# Patient Record
Sex: Female | Born: 1955 | Race: Black or African American | Hispanic: No | Marital: Single | Smoking: Never smoker
Health system: Southern US, Community
[De-identification: ages and names within clinical notes are randomized; demographics above are authoritative.]

## PROBLEM LIST (undated history)

## (undated) ENCOUNTER — Ambulatory Visit
Admission: RE | Disposition: A | Discharge status: Home / Self Care | Payer: BC Managed Care – PPO | Source: Ambulatory Visit | Attending: Emergency Medicine | Admitting: Emergency Medicine

## (undated) DIAGNOSIS — L03021 Acute lymphangitis of right finger: Secondary | ICD-10-CM

## (undated) DIAGNOSIS — K219 Gastro-esophageal reflux disease without esophagitis: Secondary | ICD-10-CM

## (undated) DIAGNOSIS — R51 Headache: Secondary | ICD-10-CM

## (undated) HISTORY — PX: OTHER SURGICAL HISTORY: SHX169

---

## 1997-11-10 ENCOUNTER — Other Ambulatory Visit: Admission: RE | Admit: 1997-11-10 | Discharge: 1997-11-10 | Payer: Self-pay | Admitting: *Deleted

## 1998-01-05 ENCOUNTER — Encounter: Admission: RE | Admit: 1998-01-05 | Discharge: 1998-04-05 | Payer: Self-pay | Admitting: Internal Medicine

## 1998-04-28 ENCOUNTER — Encounter: Admission: RE | Admit: 1998-04-28 | Discharge: 1998-06-16 | Payer: Self-pay | Admitting: Internal Medicine

## 1998-11-23 ENCOUNTER — Encounter: Admission: RE | Admit: 1998-11-23 | Discharge: 1999-02-21 | Payer: Self-pay | Admitting: Internal Medicine

## 1998-12-27 ENCOUNTER — Other Ambulatory Visit: Admission: RE | Admit: 1998-12-27 | Discharge: 1998-12-27 | Payer: Self-pay | Admitting: *Deleted

## 1999-03-10 ENCOUNTER — Encounter: Admission: RE | Admit: 1999-03-10 | Discharge: 1999-05-18 | Payer: Self-pay | Admitting: Internal Medicine

## 1999-10-12 ENCOUNTER — Encounter: Admission: RE | Admit: 1999-10-12 | Discharge: 1999-11-09 | Payer: Self-pay | Admitting: Internal Medicine

## 2000-02-06 ENCOUNTER — Other Ambulatory Visit: Admission: RE | Admit: 2000-02-06 | Discharge: 2000-02-06 | Payer: Self-pay | Admitting: *Deleted

## 2000-05-13 ENCOUNTER — Encounter: Admission: RE | Admit: 2000-05-13 | Discharge: 2000-05-13 | Payer: Self-pay | Admitting: Internal Medicine

## 2000-05-13 ENCOUNTER — Encounter: Payer: Self-pay | Admitting: Internal Medicine

## 2000-05-16 ENCOUNTER — Encounter: Admission: RE | Admit: 2000-05-16 | Discharge: 2000-05-16 | Payer: Self-pay | Admitting: Internal Medicine

## 2000-05-16 ENCOUNTER — Encounter: Payer: Self-pay | Admitting: Internal Medicine

## 2001-07-10 ENCOUNTER — Encounter: Admission: RE | Admit: 2001-07-10 | Discharge: 2001-07-10 | Payer: Self-pay | Admitting: Internal Medicine

## 2001-07-10 ENCOUNTER — Encounter: Payer: Self-pay | Admitting: Internal Medicine

## 2001-07-22 ENCOUNTER — Encounter: Admission: RE | Admit: 2001-07-22 | Discharge: 2001-07-22 | Payer: Self-pay | Admitting: Internal Medicine

## 2001-07-22 ENCOUNTER — Encounter: Payer: Self-pay | Admitting: Internal Medicine

## 2002-02-13 ENCOUNTER — Encounter: Payer: Self-pay | Admitting: Internal Medicine

## 2002-02-13 ENCOUNTER — Encounter: Admission: RE | Admit: 2002-02-13 | Discharge: 2002-02-13 | Payer: Self-pay | Admitting: Internal Medicine

## 2003-07-20 ENCOUNTER — Encounter: Admission: RE | Admit: 2003-07-20 | Discharge: 2003-07-20 | Payer: Self-pay | Admitting: Internal Medicine

## 2003-09-27 ENCOUNTER — Encounter (INDEPENDENT_AMBULATORY_CARE_PROVIDER_SITE_OTHER): Payer: Self-pay | Admitting: Specialist

## 2003-09-27 ENCOUNTER — Ambulatory Visit (HOSPITAL_COMMUNITY): Admission: RE | Admit: 2003-09-27 | Discharge: 2003-09-27 | Payer: Self-pay | Admitting: Obstetrics & Gynecology

## 2004-10-16 ENCOUNTER — Other Ambulatory Visit: Admission: RE | Admit: 2004-10-16 | Discharge: 2004-10-16 | Payer: Self-pay | Admitting: Internal Medicine

## 2004-11-17 ENCOUNTER — Encounter: Admission: RE | Admit: 2004-11-17 | Discharge: 2004-11-17 | Payer: Self-pay | Admitting: Orthopaedic Surgery

## 2005-04-02 HISTORY — PX: UTERINE FIBROID SURGERY: SHX826

## 2005-11-07 ENCOUNTER — Other Ambulatory Visit: Admission: RE | Admit: 2005-11-07 | Discharge: 2005-11-07 | Payer: Self-pay | Admitting: Gynecology

## 2005-11-12 ENCOUNTER — Encounter: Admission: RE | Admit: 2005-11-12 | Discharge: 2005-11-12 | Payer: Self-pay | Admitting: Gynecology

## 2006-05-06 ENCOUNTER — Encounter: Admission: RE | Admit: 2006-05-06 | Discharge: 2006-05-06 | Payer: Self-pay | Admitting: Gynecology

## 2006-12-04 ENCOUNTER — Encounter: Admission: RE | Admit: 2006-12-04 | Discharge: 2006-12-04 | Payer: Self-pay | Admitting: Gynecology

## 2007-05-08 ENCOUNTER — Other Ambulatory Visit: Admission: RE | Admit: 2007-05-08 | Discharge: 2007-05-08 | Payer: Self-pay | Admitting: Gynecology

## 2008-01-16 ENCOUNTER — Encounter: Admission: RE | Admit: 2008-01-16 | Discharge: 2008-01-16 | Payer: Self-pay | Admitting: Internal Medicine

## 2010-01-31 HISTORY — PX: OTHER SURGICAL HISTORY: SHX169

## 2010-08-18 NOTE — Op Note (Signed)
NAME:  Marcia Walker, Marcia Walker                          ACCOUNT NO.:  [id number]   MEDICAL RECORD NO.:  [id number]                   PATIENT TYPE:  AMB   LOCATION:  [facility]                                  FACILITY:  [facility]   PHYSICIAN:  Genia Del, M.D.             DATE OF BIRTH:  01-28-1956   DATE OF PROCEDURE:  09/27/2003  DATE OF DISCHARGE:                                 OPERATIVE REPORT   PREOPERATIVE DIAGNOSES:  Menometrorrhagia with secondary anemia and  submucosal myoma.   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   INTERVENTION:  Hysteroscopy with resection of myoma and dilatation and  curettage.   ANESTHESIOLOGIST:  Burnett Corrente, M.D.   SURGEON:  Genia Del, M.D.   DESCRIPTION OF PROCEDURE:  Under general anesthesia with endotracheal  intubation, the patient is lithotomy position. She is prepped with Hibiclens  in the suprapubic, vulvar and vaginal areas and draped as usual. The vaginal  exam reveals an anteverted uterus irregular with uterine myoma mobile. No  adnexal mass, the cervix is long and closed.  No active bleeding is present.  We introduce the speculum in the vagina, the anterior lip of the cervix is  grasped with a tenaculum and a paracervical block is done with Nesacaine 1%  20 mL at 4 and 8 o'clock. We then dilate the cervix up to Hegar dilator #25.  The diagnostic hysteroscope is introduced, pictures are taken. The  submucosal myomas are seen, the one anteriorly has a larger submucosal  component. A small submucosal component is seen posteriorly.  No other  lesions are seen and the ostia are visualized and pictures are taken on both  sides. We complete dilatation without difficulty up to Hegar #31 and then  introduce the resectoscope with a double loop inside the intrauterine. The  anterior submucosal myoma is resected to about a 1/4.  It is quite large and  further  resection is considered risky for complications.  Also the deficit  at that point is at 570 mL.  The posterior myoma is just coming submucosally  at one area and that part is resected with the loop.  Hemostasis is  completed with the loop with coag and the instrument is then removed. A  curettage of the intrauterine cavity is done systematically on all surfaces  with a sharp curette.  The specimen is sent to pathology.  Hemostasis is  adequate inside the cavity. We then removed the tenaculum and it is noted  that the cervix is bleeding, mild tearing occurred at the level during  dilatation of the cervix. Therefore a stitch of 2-0 Vicryl is used as an X  suture to control hemostasis.  This effectively controlled hemostasis. The  rest of the instruments are removed. The estimated blood loss was about 100  mL, deficits were 570 mL.  No complications occurred and the patient was  transferred to the recovery room in good status.                                               Genia Del, M.D.    ML/MEDQ  D:  09/27/2003  T:  09/27/2003  Job:  [id number]

## 2011-03-03 HISTORY — PX: OTHER SURGICAL HISTORY: SHX169

## 2011-09-03 ENCOUNTER — Other Ambulatory Visit: Payer: Self-pay | Admitting: Orthopaedic Surgery

## 2011-09-03 DIAGNOSIS — M25512 Pain in left shoulder: Secondary | ICD-10-CM

## 2011-09-04 ENCOUNTER — Other Ambulatory Visit: Payer: Self-pay

## 2011-12-17 ENCOUNTER — Other Ambulatory Visit: Payer: Self-pay | Admitting: Orthopedic Surgery

## 2011-12-17 ENCOUNTER — Encounter (HOSPITAL_COMMUNITY): Payer: Self-pay | Admitting: *Deleted

## 2011-12-17 ENCOUNTER — Ambulatory Visit (HOSPITAL_COMMUNITY): Payer: BC Managed Care – PPO | Admitting: *Deleted

## 2011-12-17 ENCOUNTER — Encounter (HOSPITAL_COMMUNITY)
Admission: RE | Disposition: A | Discharge status: Home / Self Care | Payer: Self-pay | Source: Ambulatory Visit | Attending: Orthopedic Surgery

## 2011-12-17 ENCOUNTER — Ambulatory Visit (HOSPITAL_COMMUNITY)
Admission: RE | Admit: 2011-12-17 | Discharge: 2011-12-17 | Disposition: A | Discharge status: Home / Self Care | Payer: BC Managed Care – PPO | Source: Ambulatory Visit | Attending: Orthopedic Surgery | Admitting: Orthopedic Surgery

## 2011-12-17 ENCOUNTER — Encounter (HOSPITAL_COMMUNITY): Payer: Self-pay

## 2011-12-17 DIAGNOSIS — IMO0002 Reserved for concepts with insufficient information to code with codable children: Secondary | ICD-10-CM | POA: Insufficient documentation

## 2011-12-17 DIAGNOSIS — L03021 Acute lymphangitis of right finger: Secondary | ICD-10-CM | POA: Diagnosis present

## 2011-12-17 DIAGNOSIS — L03019 Cellulitis of unspecified finger: Secondary | ICD-10-CM | POA: Insufficient documentation

## 2011-12-17 HISTORY — PX: I & D EXTREMITY: SHX5045

## 2011-12-17 HISTORY — DX: Headache: R51

## 2011-12-17 HISTORY — DX: Acute lymphangitis of right finger: L03.021

## 2011-12-17 LAB — SURGICAL PCR SCREEN
MRSA, PCR: NEGATIVE
Staphylococcus aureus: POSITIVE — AB

## 2011-12-17 LAB — GRAM STAIN

## 2011-12-17 SURGERY — IRRIGATION AND DEBRIDEMENT EXTREMITY
Anesthesia: General | Site: Finger | Laterality: Right | Wound class: Dirty or Infected

## 2011-12-17 MED ORDER — MUPIROCIN 2 % EX OINT
TOPICAL_OINTMENT | Freq: Once | CUTANEOUS | Status: AC
  Administered 2011-12-17: 1 via NASAL

## 2011-12-17 MED ORDER — ONDANSETRON HCL 4 MG/2ML IJ SOLN
INTRAMUSCULAR | Status: DC | PRN
  Administered 2011-12-17: 4 mg via INTRAVENOUS

## 2011-12-17 MED ORDER — SUFENTANIL CITRATE 50 MCG/ML IV SOLN
INTRAVENOUS | Status: DC | PRN
  Administered 2011-12-17: 5 ug via INTRAVENOUS

## 2011-12-17 MED ORDER — PROPOFOL 10 MG/ML IV BOLUS
INTRAVENOUS | Status: DC | PRN
  Administered 2011-12-17: 120 mg via INTRAVENOUS

## 2011-12-17 MED ORDER — MIDAZOLAM HCL 5 MG/5ML IJ SOLN
INTRAMUSCULAR | Status: DC | PRN
  Administered 2011-12-17: 2 mg via INTRAVENOUS

## 2011-12-17 MED ORDER — BUPIVACAINE HCL (PF) 0.25 % IJ SOLN
INTRAMUSCULAR | Status: AC
  Filled 2011-12-17: qty 30

## 2011-12-17 MED ORDER — MIDAZOLAM HCL 2 MG/2ML IJ SOLN: 1.0000 mg | INTRAMUSCULAR | Status: DC | PRN

## 2011-12-17 MED ORDER — HYDROMORPHONE HCL PF 1 MG/ML IJ SOLN: 0.2500 mg | INTRAMUSCULAR | Status: DC | PRN

## 2011-12-17 MED ORDER — OXYCODONE-ACETAMINOPHEN 5-325 MG PO TABS: 1.0000 | ORAL_TABLET | ORAL | Status: DC | PRN

## 2011-12-17 MED ORDER — PROMETHAZINE HCL 25 MG/ML IJ SOLN: 6.2500 mg | INTRAMUSCULAR | Status: DC | PRN

## 2011-12-17 MED ORDER — LACTATED RINGERS IV SOLN
INTRAVENOUS | Status: DC
  Administered 2011-12-17 (×2): via INTRAVENOUS

## 2011-12-17 MED ORDER — SODIUM CHLORIDE 0.9 % IR SOLN
Status: DC | PRN
  Administered 2011-12-17: 1000 mL

## 2011-12-17 MED ORDER — CHLORHEXIDINE GLUCONATE 4 % EX LIQD: 60.0000 mL | Freq: Once | CUTANEOUS | Status: DC

## 2011-12-17 MED ORDER — BUPIVACAINE HCL (PF) 0.25 % IJ SOLN
INTRAMUSCULAR | Status: DC | PRN
  Administered 2011-12-17: 2 mL

## 2011-12-17 MED ORDER — LIDOCAINE HCL (CARDIAC) 20 MG/ML IV SOLN
INTRAVENOUS | Status: DC | PRN
  Administered 2011-12-17: 50 mg via INTRAVENOUS

## 2011-12-17 MED ORDER — MUPIROCIN 2 % EX OINT
TOPICAL_OINTMENT | CUTANEOUS | Status: AC
  Administered 2011-12-17: 1 via NASAL
  Filled 2011-12-17: qty 22

## 2011-12-17 SURGICAL SUPPLY — 53 items
BANDAGE CONFORM 2  STR LF (GAUZE/BANDAGES/DRESSINGS) IMPLANT
BANDAGE ELASTIC 3 VELCRO ST LF (GAUZE/BANDAGES/DRESSINGS) ×1 IMPLANT
BANDAGE ELASTIC 4 VELCRO ST LF (GAUZE/BANDAGES/DRESSINGS) ×1 IMPLANT
BANDAGE GAUZE ELAST BULKY 4 IN (GAUZE/BANDAGES/DRESSINGS) ×3 IMPLANT
BNDG CMPR 9X4 STRL LF SNTH (GAUZE/BANDAGES/DRESSINGS)
BNDG COHESIVE 1X5 TAN STRL LF (GAUZE/BANDAGES/DRESSINGS) ×1 IMPLANT
BNDG ESMARK 4X9 LF (GAUZE/BANDAGES/DRESSINGS) IMPLANT
CLOTH BEACON ORANGE TIMEOUT ST (SAFETY) ×2 IMPLANT
CORDS BIPOLAR (ELECTRODE) IMPLANT
COVER SURGICAL LIGHT HANDLE (MISCELLANEOUS) ×4 IMPLANT
CUFF TOURNIQUET SINGLE 18IN (TOURNIQUET CUFF) ×2 IMPLANT
DECANTER SPIKE VIAL GLASS SM (MISCELLANEOUS) ×2 IMPLANT
DRAIN PENROSE 1/4X12 LTX STRL (WOUND CARE) IMPLANT
DRAPE OEC MINIVIEW 54X84 (DRAPES) IMPLANT
DRAPE SURG 17X23 STRL (DRAPES) ×2 IMPLANT
DRSG PAD ABDOMINAL 8X10 ST (GAUZE/BANDAGES/DRESSINGS) ×2 IMPLANT
DURAPREP 26ML APPLICATOR (WOUND CARE) IMPLANT
ELECT REM PT RETURN 9FT ADLT (ELECTROSURGICAL)
ELECTRODE REM PT RTRN 9FT ADLT (ELECTROSURGICAL) IMPLANT
GAUZE PACKING IODOFORM 1/4X5 (PACKING) IMPLANT
GAUZE XEROFORM 1X8 LF (GAUZE/BANDAGES/DRESSINGS) ×2 IMPLANT
GLOVE BIO SURGEON STRL SZ8.5 (GLOVE) ×2 IMPLANT
GOWN SRG XL XLNG 56XLVL 4 (GOWN DISPOSABLE) ×1 IMPLANT
GOWN STRL NON-REIN LRG LVL3 (GOWN DISPOSABLE) ×2 IMPLANT
GOWN STRL NON-REIN XL XLG LVL4 (GOWN DISPOSABLE) ×2
HANDPIECE INTERPULSE COAX TIP (DISPOSABLE)
KIT BASIN OR (CUSTOM PROCEDURE TRAY) ×2 IMPLANT
KIT ROOM TURNOVER OR (KITS) ×2 IMPLANT
MANIFOLD NEPTUNE II (INSTRUMENTS) ×1 IMPLANT
NDL HYPO 25GX1X1/2 BEV (NEEDLE) IMPLANT
NDL HYPO 25X1 1.5 SAFETY (NEEDLE) ×1 IMPLANT
NEEDLE HYPO 25GX1X1/2 BEV (NEEDLE) ×2 IMPLANT
NEEDLE HYPO 25X1 1.5 SAFETY (NEEDLE) ×2 IMPLANT
NS IRRIG 1000ML POUR BTL (IV SOLUTION) ×2 IMPLANT
PACK ORTHO EXTREMITY (CUSTOM PROCEDURE TRAY) ×2 IMPLANT
PAD ARMBOARD 7.5X6 YLW CONV (MISCELLANEOUS) ×4 IMPLANT
PAD CAST 4YDX4 CTTN HI CHSV (CAST SUPPLIES) ×2 IMPLANT
PADDING CAST COTTON 4X4 STRL (CAST SUPPLIES)
SET HNDPC FAN SPRY TIP SCT (DISPOSABLE) IMPLANT
SPONGE GAUZE 4X4 12PLY (GAUZE/BANDAGES/DRESSINGS) ×3 IMPLANT
SPONGE LAP 18X18 X RAY DECT (DISPOSABLE) ×1 IMPLANT
SUCTION FRAZIER TIP 10 FR DISP (SUCTIONS) ×2 IMPLANT
SUT VICRYL RAPIDE 4/0 PS 2 (SUTURE) IMPLANT
SWAB CULTURE LIQUID MINI MALE (MISCELLANEOUS) ×1 IMPLANT
SYR 20CC LL (SYRINGE) IMPLANT
SYR CONTROL 10ML LL (SYRINGE) ×2 IMPLANT
TOWEL OR 17X24 6PK STRL BLUE (TOWEL DISPOSABLE) ×2 IMPLANT
TOWEL OR 17X26 10 PK STRL BLUE (TOWEL DISPOSABLE) ×2 IMPLANT
TUBE ANAEROBIC SPECIMEN COL (MISCELLANEOUS) ×1 IMPLANT
TUBE CONNECTING 12X1/4 (SUCTIONS) ×2 IMPLANT
UNDERPAD 30X30 INCONTINENT (UNDERPADS AND DIAPERS) ×2 IMPLANT
WATER STERILE IRR 1000ML POUR (IV SOLUTION) ×1 IMPLANT
YANKAUER SUCT BULB TIP NO VENT (SUCTIONS) ×2 IMPLANT

## 2011-12-17 NOTE — H&P (Signed)
Marcia Walker is an 56 y.o. female.   Chief Complaint: right ring felon HPI: 36 hour h/o right ring volar swelling c/w felon  No past medical history on file.  No past surgical history on file.  No family history on file. Social History:  does not have a smoking history on file. She does not have any smokeless tobacco history on file. Her alcohol and drug histories not on file.  Allergies: Allergies not on file  No prescriptions prior to admission    No results found for this or any previous visit (from the past 48 hour(s)). No results found.  Review of Systems  All other systems reviewed and are negative.    There were no vitals taken for this visit. Physical Exam  Constitutional: She is oriented to person, place, and time. She appears well-developed and well-nourished.  HENT:  Head: Normocephalic and atraumatic.  Cardiovascular: Normal rate.   Respiratory: Effort normal.  Musculoskeletal:       Hands: Neurological: She is alert and oriented to person, place, and time.  Skin: Skin is warm.  Psychiatric: She has a normal mood and affect. Her behavior is normal. Judgment and thought content normal.     Assessment/Plan As above  Plan I and D above Pratik Dalziel A 12/17/2011, 5:08 PM

## 2011-12-17 NOTE — Anesthesia Postprocedure Evaluation (Signed)
  Anesthesia Post-op Note  Patient: Marcia Walker  Procedure(s) Performed: Procedure(s) (LRB) with comments: IRRIGATION AND DEBRIDEMENT EXTREMITY (Right) - Right Ring Finger  Patient Location: PACU  Anesthesia Type: General  Level of Consciousness: awake and alert   Airway and Oxygen Therapy: Patient Spontanous Breathing  Post-op Pain: mild  Post-op Assessment: Post-op Vital signs reviewed, Patient's Cardiovascular Status Stable, Respiratory Function Stable, Patent Airway, No signs of Nausea or vomiting and Pain level controlled  Post-op Vital Signs: stable  Complications: No apparent anesthesia complications

## 2011-12-17 NOTE — Op Note (Signed)
See note [phone]

## 2011-12-17 NOTE — Brief Op Note (Signed)
12/17/2011  7:02 PM  PATIENT:  Marcia Walker  56 y.o. female  PRE-OPERATIVE DIAGNOSIS:  Right Ring Finger Infection  POST-OPERATIVE DIAGNOSIS:  Right ring finger infection  PROCEDURE:  Procedure(s) (LRB) with comments: IRRIGATION AND DEBRIDEMENT EXTREMITY (Right) - Right Ring Finger  SURGEON:  Surgeon(s) and Role:    * Marlowe Shores, MD - Primary  PHYSICIAN ASSISTANT:   ASSISTANTS: none   ANESTHESIA:   general  EBL:     BLOOD ADMINISTERED:none  DRAINS: none   LOCAL MEDICATIONS USED:  MARCAINE   2cc  SPECIMEN:  Lavage/Washing  DISPOSITION OF SPECIMEN:  micro  COUNTS:  YES  TOURNIQUET:   Total Tourniquet Time Documented: Upper Arm (Right) - 9 minutes  DICTATION: .Other Dictation: Dictation Number [id number]  PLAN OF CARE: Discharge to home after PACU  PATIENT DISPOSITION:  PACU - hemodynamically stable.   Delay start of Pharmacological VTE agent (>24hrs) due to surgical blood loss or risk of bleeding: not applicable

## 2011-12-17 NOTE — Anesthesia Preprocedure Evaluation (Addendum)
Anesthesia Evaluation  Patient identified by MRN, date of birth, ID band Patient awake    Reviewed: Allergy & Precautions, H&P , NPO status , Patient's Chart, lab work & pertinent test results, reviewed documented beta blocker date and time   Airway Mallampati: I TM Distance: >3 FB     Dental  (+) Teeth Intact and Dental Advisory Given   Pulmonary  breath sounds clear to auscultation        Cardiovascular Rhythm:Regular Rate:Normal     Neuro/Psych    GI/Hepatic   Endo/Other    Renal/GU      Musculoskeletal   Abdominal   Peds  Hematology   Anesthesia Other Findings   Reproductive/Obstetrics                          Anesthesia Physical Anesthesia Plan  ASA: I and Emergent  Anesthesia Plan: General   Post-op Pain Management:    Induction: Intravenous  Airway Management Planned: LMA  Additional Equipment:   Intra-op Plan:   Post-operative Plan: Extubation in OR  Informed Consent: I have reviewed the patients History and Physical, chart, labs and discussed the procedure including the risks, benefits and alternatives for the proposed anesthesia with the patient or authorized representative who has indicated his/her understanding and acceptance.     Plan Discussed with: CRNA, Surgeon and Anesthesiologist  Anesthesia Plan Comments:        Anesthesia Quick Evaluation

## 2011-12-17 NOTE — Transfer of Care (Signed)
Immediate Anesthesia Transfer of Care Note  Patient: Marcia Walker  Procedure(s) Performed: Procedure(s) (LRB) with comments: IRRIGATION AND DEBRIDEMENT EXTREMITY (Right) - Right Ring Finger  Patient Location: PACU  Anesthesia Type: General  Level of Consciousness: awake and alert   Airway & Oxygen Therapy: Patient Spontanous Breathing and Patient connected to face mask oxygen  Post-op Assessment: Report given to PACU RN and Post -op Vital signs reviewed and stable  Post vital signs: Reviewed and stable  Complications: No apparent anesthesia complications

## 2011-12-17 NOTE — Preoperative (Signed)
Beta Blockers   Reason not to administer Beta Blockers:Not Applicable 

## 2011-12-18 NOTE — Op Note (Signed)
NAMEDESTIN, KITTLER                ACCOUNT NO.:  [id number]  MEDICAL RECORD NO.:  [id number]  LOCATION:  [facility]                         FACILITY:  [facility]  PHYSICIAN:  Artist Pais. Queenie Aufiero, M.D.DATE OF BIRTH:  1955/08/09  DATE OF PROCEDURE:  12/17/2011 DATE OF DISCHARGE:  12/17/2011                              OPERATIVE REPORT   PREOPERATIVE DIAGNOSIS:  Right ring finger infection.  POSTOPERATIVE DIAGNOSIS:  Right ring finger infection.  PROCEDURE:  Incision and drainage above with open packing.  SURGEON:  Artist Pais. Mina Marble, M.D.  ASSISTANT:  None.  ANESTHESIA:  General.  COMPLICATIONS:  No complications.  DRAINS:  No drains.  DESCRIPTION OF PROCEDURE:  The patient was taken to the operating suite. After induction of general anesthesia, right upper extremity was prepped and draped in sterile fashion.  An Esmarch was used to exsanguinate the limb.  Tourniquet inflated to 250 mmHg.  At this point in time, a midline incision was made from the DIP joint flexion crease distally to incise a Phalen/paronychia along the ulnar side of the ring finger. Once the skin was incised, purulence was encountered.  It was cultured for aerobic and anaerobic bacteria, stat Gram stain.  A small bit of the nail plate was elevated off the nail bed and some purulence was seen coming from the nail as well.  This was removed with a longitudinal split removing the ulnar border of the nail plate.  This was irrigated, the wounds were packed open with Xeroform 1 x 8 cut into pieces the skin was then wrapped with 4 x 4s and Coban wrap.  The patient tolerated the procedure well and went to the recovery room in a stable fashion.     Artist Pais Mina Marble, M.D.     MAW/MEDQ  D:  12/17/2011  T:  12/18/2011  Job:  [id number]

## 2011-12-19 ENCOUNTER — Encounter (HOSPITAL_COMMUNITY): Payer: Self-pay | Admitting: Orthopedic Surgery

## 2011-12-21 LAB — CULTURE, ROUTINE-ABSCESS

## 2011-12-22 LAB — ANAEROBIC CULTURE

## 2014-12-28 ENCOUNTER — Other Ambulatory Visit: Payer: Self-pay | Admitting: Gynecology

## 2014-12-29 LAB — CYTOLOGY - PAP

## 2015-02-26 ENCOUNTER — Encounter (HOSPITAL_COMMUNITY): Payer: Self-pay | Admitting: Emergency Medicine

## 2015-02-26 ENCOUNTER — Emergency Department (HOSPITAL_COMMUNITY)
Admission: EM | Admit: 2015-02-26 | Discharge: 2015-02-27 | Disposition: A | Discharge status: Home / Self Care | Payer: BLUE CROSS/BLUE SHIELD | Attending: Emergency Medicine | Admitting: Emergency Medicine

## 2015-02-26 ENCOUNTER — Emergency Department (HOSPITAL_COMMUNITY): Payer: BLUE CROSS/BLUE SHIELD

## 2015-02-26 DIAGNOSIS — Z79899 Other long term (current) drug therapy: Secondary | ICD-10-CM | POA: Diagnosis not present

## 2015-02-26 DIAGNOSIS — K5900 Constipation, unspecified: Secondary | ICD-10-CM | POA: Diagnosis not present

## 2015-02-26 DIAGNOSIS — Z880 Allergy status to penicillin: Secondary | ICD-10-CM | POA: Insufficient documentation

## 2015-02-26 DIAGNOSIS — R1013 Epigastric pain: Secondary | ICD-10-CM | POA: Diagnosis present

## 2015-02-26 DIAGNOSIS — R1011 Right upper quadrant pain: Secondary | ICD-10-CM

## 2015-02-26 LAB — CBC
HEMATOCRIT: 39.7 % (ref 36.0–46.0)
Hemoglobin: 13.1 g/dL (ref 12.0–15.0)
MCH: 29.7 pg (ref 26.0–34.0)
MCHC: 33 g/dL (ref 30.0–36.0)
MCV: 90 fL (ref 78.0–100.0)
Platelets: 259 10*3/uL (ref 150–400)
RBC: 4.41 MIL/uL (ref 3.87–5.11)
RDW: 12.9 % (ref 11.5–15.5)
WBC: 4 10*3/uL (ref 4.0–10.5)

## 2015-02-26 LAB — LIPASE, BLOOD: LIPASE: 31 U/L (ref 11–51)

## 2015-02-26 LAB — COMPREHENSIVE METABOLIC PANEL
ALBUMIN: 4.8 g/dL (ref 3.5–5.0)
ALT: 18 U/L (ref 14–54)
AST: 21 U/L (ref 15–41)
Alkaline Phosphatase: 77 U/L (ref 38–126)
Anion gap: 9 (ref 5–15)
BUN: 8 mg/dL (ref 6–20)
CHLORIDE: 101 mmol/L (ref 101–111)
CO2: 28 mmol/L (ref 22–32)
CREATININE: 0.65 mg/dL (ref 0.44–1.00)
Calcium: 9.6 mg/dL (ref 8.9–10.3)
GFR calc Af Amer: >60 mL/min (ref >60)
GLUCOSE: 103 mg/dL — AB (ref 65–99)
POTASSIUM: 4 mmol/L (ref 3.5–5.1)
SODIUM: 138 mmol/L (ref 135–145)
Total Bilirubin: 0.6 mg/dL (ref 0.3–1.2)
Total Protein: 8.3 g/dL — ABNORMAL HIGH (ref 6.5–8.1)

## 2015-02-26 NOTE — ED Provider Notes (Signed)
MSE was initiated and I personally evaluated the patient and placed orders (if any) at  9:32 PM on February 26, 2015.  The patient appears stable so that the remainder of the MSE may be completed by another provider.Pt seen in triage.  Epigastric and RUQ pain, worse after eating, labs, RUQ u/s ordered, back to lobby until bed available  Rolan BuccoMelanie Yaresly Menzel, MD 02/26/15 2132

## 2015-02-26 NOTE — ED Notes (Signed)
U/s at bedside

## 2015-02-26 NOTE — ED Notes (Addendum)
Pt was told to take Amitiza and increased servings of fruit when she saw the PCP recently for constipation (has had constipation issues since she was little but never to this extreme). Was told by pharmacist not to do an enema with this medication but did anyway. Says the only thing that came out was mucous. Has been taking stool softeners and yogurt with no alleviation of constipation. Has increased water intake. Now is having intense abdominal pain. Able to burp-unable to eat without pain. Reports increased fatigue. Last normal BM was 2 weeks ago. Also reports increased coldness. Increased pain in RUQ. Denies blood in stools. No other c/c.

## 2015-02-27 MED ORDER — SODIUM CHLORIDE 0.9 % IV BOLUS (SEPSIS)
1000.0000 mL | Freq: Once | INTRAVENOUS | Status: AC
  Administered 2015-02-27: 1000 mL via INTRAVENOUS

## 2015-02-27 MED ORDER — PANTOPRAZOLE SODIUM 20 MG PO TBEC: 20.0000 mg | DELAYED_RELEASE_TABLET | Freq: Every day | ORAL | Status: DC

## 2015-02-27 MED ORDER — POLYETHYLENE GLYCOL 3350 17 G PO PACK: 17.0000 g | PACK | Freq: Every day | ORAL | Status: DC

## 2015-02-27 MED ORDER — GI COCKTAIL ~~LOC~~
30.0000 mL | Freq: Once | ORAL | Status: AC
  Administered 2015-02-27: 30 mL via ORAL
  Filled 2015-02-27: qty 30

## 2015-02-27 MED ORDER — PANTOPRAZOLE SODIUM 40 MG IV SOLR
40.0000 mg | Freq: Once | INTRAVENOUS | Status: AC
  Administered 2015-02-27: 40 mg via INTRAVENOUS
  Filled 2015-02-27: qty 40

## 2015-02-27 NOTE — ED Provider Notes (Signed)
CSN: [id number]     Arrival date & time 02/26/15  2015 History   First MD Initiated Contact with Patient 02/27/15 0123     Chief Complaint  Patient presents with  . Abdominal Pain     (Consider location/radiation/quality/duration/timing/severity/associated sxs/prior Treatment) Patient is a 59 y.o. female presenting with abdominal pain. The history is provided by the patient. No language interpreter was used.  Abdominal Pain Pain location:  Epigastric Pain quality: aching and sharp   Associated symptoms: constipation   Associated symptoms: no chest pain, no chills, no fever, no nausea, no shortness of breath and no vomiting   Associated symptoms comment:  Patient presents with one week of epigastric abdominal pain that started as intermittent pain related to any PO ingestion, liquid or solid. It has become constant pain that continues to be exacerbated with any PO ingestion. No vomiting. No fever. She reports constipation issues that have also been progressively worsening despite use of prescribed Amitiza, enemas, Miralax and bisacodyl. She denies history of or recent melena or blood per rectum. No previous abdominal surgeries. She has never had a colonoscopy. No history of ulcers.    Past Medical History  Diagnosis Date  . Headache(784.0)     with allergies   Past Surgical History  Procedure Laterality Date  . Right shoulder manipulation 03/2011  03/2011  . Left shouler manipulation  01/2010  . Other surgical history  15 yrs ago    cyst removed from end of spine  . Uterine fibroid surgery  2007  . I&d extremity  12/17/2011    Procedure: IRRIGATION AND DEBRIDEMENT EXTREMITY;  Surgeon: Marlowe Shores, MD;  Location: [facility] OR;  Service: Orthopedics;  Laterality: Right;  Right Ring Finger   History reviewed. No pertinent family history. Social History  Substance Use Topics  . Smoking status: Never Smoker   . Smokeless tobacco: Never Used  . Alcohol Use: No   OB History    No  data available     Review of Systems  Constitutional: Negative for fever and chills.  Respiratory: Negative.  Negative for shortness of breath.   Cardiovascular: Negative.  Negative for chest pain.  Gastrointestinal: Positive for abdominal pain and constipation. Negative for nausea and vomiting.  Musculoskeletal: Negative.   Skin: Negative.   Neurological: Negative.       Allergies  Prednisone; Claritin; Darvocet; Other; Penicillins; and Vibramycin  Home Medications   Prior to Admission medications   Medication Sig Start Date End Date Taking? Authorizing Provider  bisacodyl (DULCOLAX) 5 MG EC tablet Take 5 mg by mouth 2 (two) times daily as needed for moderate constipation (for constipation).   Yes Historical Provider, MD  cetirizine (ZYRTEC) 10 MG tablet Take 10 mg by mouth daily as needed. For allergies   Yes Historical Provider, MD  cycloSPORINE (RESTASIS) 0.05 % ophthalmic emulsion Place 1 drop into both eyes at bedtime as needed (for allergy eye).    Yes Historical Provider, MD  docusate sodium (COLACE) 100 MG capsule Take 100 mg by mouth 3 (three) times daily.   Yes Historical Provider, MD  lubiprostone (AMITIZA) 8 MCG capsule Take 8 mcg by mouth 2 (two) times daily with a meal.   Yes Historical Provider, MD  naproxen sodium (ANAPROX) 220 MG tablet Take 220 mg by mouth 2 (two) times daily as needed (for headache/pain.).   Yes Historical Provider, MD  sodium phosphate (FLEET) enema Place 1 enema rectally daily as needed (for severe constipation). follow package directions  Yes Historical Provider, MD  valACYclovir (VALTREX) 500 MG tablet Take 500 mg by mouth daily as needed (for flares.).  12/28/14  Yes Historical Provider, MD   BP 117/66 mmHg  Pulse 62  Temp(Src) 98 F (36.7 C) (Oral)  Resp 18  SpO2 99% Physical Exam  Constitutional: She is oriented to person, place, and time. She appears well-developed and well-nourished.  HENT:  Head: Normocephalic.  Neck: Normal  range of motion. Neck supple.  Cardiovascular: Normal rate and regular rhythm.   Pulmonary/Chest: Effort normal and breath sounds normal.  Abdominal: Soft. Bowel sounds are normal. There is tenderness. There is no rebound and no guarding.  Tenderness is limited to epigastric area.  Musculoskeletal: Normal range of motion.  Neurological: She is alert and oriented to person, place, and time.  Skin: Skin is warm and dry. No rash noted.  Psychiatric: She has a normal mood and affect.    ED Course  Procedures (including critical care time) Labs Review Labs Reviewed  COMPREHENSIVE METABOLIC PANEL - Abnormal; Notable for the following:    Glucose, Bld 103 (*)    Total Protein 8.3 (*)    All other components within normal limits  LIPASE, BLOOD  CBC    Imaging Review Dg Abd 1 View  02/26/2015  CLINICAL DATA:  Intense right lower quadrant abdominal pain. Increased fatigue. Last normal bowel movement was 2 weeks ago. EXAM: ABDOMEN - 1 VIEW COMPARISON:  None. FINDINGS: Gas and stool seen throughout the colon. No small or large bowel distention. Calcified phleboliths in the pelvis. Surgical clip in the pelvis. No radiopaque stones. Visualized bones appear intact. IMPRESSION: Nonobstructive bowel gas pattern with stool-filled colon suggesting constipation. Electronically Signed   By: Burman NievesWilliam  Stevens M.D.   On: 02/26/2015 22:01   [place] Abdomen Limited Ruq  02/26/2015  CLINICAL DATA:  Right upper quadrant pain since Friday. EXAM: US ABDOMEN LIMITED - RIGHT UPPER QUADRANT COMPARISON:  None. FINDINGS: Gallbladder: No gallstones or wall thickening visualized. No sonographic Murphy sign noted. Common bile duct: Diameter: 1.7 mm, normal Liver: No focal lesion identified. Within normal limits in parenchymal echogenicity. IMPRESSION: Normal examination. Electronically Signed   By: Burman NievesWilliam  Stevens M.D.   On: 02/26/2015 23:19   I have personally reviewed and evaluated these images and lab results as part of my  medical decision-making.   EKG Interpretation None      MDM   Final diagnoses:  RUQ pain   1. Epigastric pain 2. Constipation  Labs are unremarkable. US abdomen is negative for acute findings, no gall stones. Plain film abdomen showing only constipation. The patient gains pan relief with GI cocktail. IV fluids provided. Feel she would benefit from referral to GI for further evaluation and management of multi-faceted gastroenterologic complaints.     Elpidio AnisShari Briauna Gilmartin, PA-C 02/27/15 [id number]  Derwood KaplanAnkit Nanavati, MD 02/27/15 [phone]

## 2015-02-27 NOTE — ED Notes (Signed)
Pt has decreased some,  Pt is tolerating small bites of crackers and sprite

## 2015-02-27 NOTE — Discharge Instructions (Signed)
Constipation, Adult Constipation is when a person has fewer than three bowel movements a week, has difficulty having a bowel movement, or has stools that are dry, hard, or larger than normal. As people grow older, constipation is more common. A low-fiber diet, not taking in enough fluids, and taking certain medicines may make constipation worse.  CAUSES   Certain medicines, such as antidepressants, pain medicine, iron supplements, antacids, and water pills.   Certain diseases, such as diabetes, irritable bowel syndrome (IBS), thyroid disease, or depression.   Not drinking enough water.   Not eating enough fiber-rich foods.   Stress or travel.   Lack of physical activity or exercise.   Ignoring the urge to have a bowel movement.   Using laxatives too much.  SIGNS AND SYMPTOMS   Having fewer than three bowel movements a week.   Straining to have a bowel movement.   Having stools that are hard, dry, or larger than normal.   Feeling full or bloated.   Pain in the lower abdomen.   Not feeling relief after having a bowel movement.  DIAGNOSIS  Your health care provider will take a medical history and perform a physical exam. Further testing may be done for severe constipation. Some tests may include:  A barium enema X-ray to examine your rectum, colon, and, sometimes, your small intestine.   A sigmoidoscopy to examine your lower colon.   A colonoscopy to examine your entire colon. TREATMENT  Treatment will depend on the severity of your constipation and what is causing it. Some dietary treatments include drinking more fluids and eating more fiber-rich foods. Lifestyle treatments may include regular exercise. If these diet and lifestyle recommendations do not help, your health care provider may recommend taking over-the-counter laxative medicines to help you have bowel movements. Prescription medicines may be prescribed if over-the-counter medicines do not work.    HOME CARE INSTRUCTIONS   Eat foods that have a lot of fiber, such as fruits, vegetables, whole grains, and beans.  Limit foods high in fat and processed sugars, such as french fries, hamburgers, cookies, candies, and soda.   A fiber supplement may be added to your diet if you cannot get enough fiber from foods.   Drink enough fluids to keep your urine clear or pale yellow.   Exercise regularly or as directed by your health care provider.   Go to the restroom when you have the urge to go. Do not hold it.   Only take over-the-counter or prescription medicines as directed by your health care provider. Do not take other medicines for constipation without talking to your health care provider first.  SEEK IMMEDIATE MEDICAL CARE IF:   You have bright red blood in your stool.   Your constipation lasts for more than 4 days or gets worse.   You have abdominal or rectal pain.   You have thin, pencil-like stools.   You have unexplained weight loss. MAKE SURE YOU:   Understand these instructions.  Will watch your condition.  Will get help right away if you are not doing well or get worse.   This information is not intended to replace advice given to you by your health care provider. Make sure you discuss any questions you have with your health care provider.   Document Released: 12/16/2003 Document Revised: 04/09/2014 Document Reviewed: 12/29/2012 Elsevier Interactive Patient Education 2016 Else[employer] Inc[employer] Gastritis, Adult Gastritis is soreness and swelling (inflammation) of the lining of the stomach. Gastritis can develop as a sudden  onset (acute) or long-term (chronic) condition. If gastritis is not treated, it can lead to stomach bleeding and ulcers. CAUSES  Gastritis occurs when the stomach lining is weak or damaged. Digestive juices from the stomach then inflame the weakened stomach lining. The stomach lining may be weak or damaged due to viral or bacterial infections. One  common bacterial infection is the Helicobacter pylori infection. Gastritis can also result from excessive alcohol consumption, taking certain medicines, or having too much acid in the stomach.  SYMPTOMS  In some cases, there are no symptoms. When symptoms are present, they may include:  Pain or a burning sensation in the upper abdomen.  Nausea.  Vomiting.  An uncomfortable feeling of fullness after eating. DIAGNOSIS  Your caregiver may suspect you have gastritis based on your symptoms and a physical exam. To determine the cause of your gastritis, your caregiver may perform the following:  Blood or stool tests to check for the H pylori bacterium.  Gastroscopy. A thin, flexible tube (endoscope) is passed down the esophagus and into the stomach. The endoscope has a light and camera on the end. Your caregiver uses the endoscope to view the inside of the stomach.  Taking a tissue sample (biopsy) from the stomach to examine under a microscope. TREATMENT  Depending on the cause of your gastritis, medicines may be prescribed. If you have a bacterial infection, such as an H pylori infection, antibiotics may be given. If your gastritis is caused by too much acid in the stomach, H2 blockers or antacids may be given. Your caregiver may recommend that you stop taking aspirin, ibuprofen, or other nonsteroidal anti-inflammatory drugs (NSAIDs). HOME CARE INSTRUCTIONS  Only take over-the-counter or prescription medicines as directed by your caregiver.  If you were given antibiotic medicines, take them as directed. Finish them even if you start to feel better.  Drink enough fluids to keep your urine clear or pale yellow.  Avoid foods and drinks that make your symptoms worse, such as:  Caffeine or alcoholic drinks.  Chocolate.  Peppermint or mint flavorings.  Garlic and onions.  Spicy foods.  Citrus fruits, such as oranges, lemons, or limes.  Tomato-based foods such as sauce, chili, salsa,  and pizza.  Fried and fatty foods.  Eat small, frequent meals instead of large meals. SEEK IMMEDIATE MEDICAL CARE IF:   You have black or dark red stools.  You vomit blood or material that looks like coffee grounds.  You are unable to keep fluids down.  Your abdominal pain gets worse.  You have a fever.  You do not feel better after 1 week.  You have any other questions or concerns. MAKE SURE YOU:  Understand these instructions.  Will watch your condition.  Will get help right away if you are not doing well or get worse.   This information is not intended to replace advice given to you by your health care provider. Make sure you discuss any questions you have with your health care provider.   Document Released: 03/13/2001 Document Revised: 09/18/2011 Document Reviewed: 05/02/2011 Elsevier Interactive Patient Education [employer]2016 Elsevier Inc.

## 2015-02-27 NOTE — ED Notes (Signed)
Pt states that she has actually done two enemas and this last one produced nothing but mucous,  Pt has abdominal pain around middle area,  Feels better to lay flat or stand up, unable to eat or drink very much,  Feels full

## 2015-05-21 ENCOUNTER — Emergency Department (INDEPENDENT_AMBULATORY_CARE_PROVIDER_SITE_OTHER): Payer: BLUE CROSS/BLUE SHIELD

## 2015-05-21 ENCOUNTER — Encounter (HOSPITAL_COMMUNITY): Payer: Self-pay | Admitting: *Deleted

## 2015-05-21 ENCOUNTER — Emergency Department (INDEPENDENT_AMBULATORY_CARE_PROVIDER_SITE_OTHER)
Admission: EM | Admit: 2015-05-21 | Discharge: 2015-05-21 | Disposition: A | Discharge status: Home / Self Care | Payer: BLUE CROSS/BLUE SHIELD | Source: Home / Self Care | Attending: Emergency Medicine | Admitting: Emergency Medicine

## 2015-05-21 DIAGNOSIS — J9801 Acute bronchospasm: Secondary | ICD-10-CM | POA: Diagnosis not present

## 2015-05-21 DIAGNOSIS — T7840XA Allergy, unspecified, initial encounter: Secondary | ICD-10-CM

## 2015-05-21 HISTORY — DX: Gastro-esophageal reflux disease without esophagitis: K21.9

## 2015-05-21 MED ORDER — AEROCHAMBER PLUS MISC: Status: DC

## 2015-05-21 MED ORDER — METHYLPREDNISOLONE SODIUM SUCC 125 MG IJ SOLR
125.0000 mg | Freq: Once | INTRAMUSCULAR | Status: DC
Start: 2015-05-21 — End: 2015-05-21

## 2015-05-21 MED ORDER — IPRATROPIUM-ALBUTEROL 0.5-2.5 (3) MG/3ML IN SOLN
3.0000 mL | Freq: Once | RESPIRATORY_TRACT | Status: AC
  Administered 2015-05-21: 3 mL via RESPIRATORY_TRACT

## 2015-05-21 MED ORDER — IPRATROPIUM-ALBUTEROL 0.5-2.5 (3) MG/3ML IN SOLN
RESPIRATORY_TRACT | Status: AC
  Filled 2015-05-21: qty 3

## 2015-05-21 MED ORDER — METHYLPREDNISOLONE SODIUM SUCC 125 MG IJ SOLR
INTRAMUSCULAR | Status: AC
  Filled 2015-05-21: qty 2

## 2015-05-21 MED ORDER — ALBUTEROL SULFATE HFA 108 (90 BASE) MCG/ACT IN AERS: 1.0000 | INHALATION_SPRAY | Freq: Four times a day (QID) | RESPIRATORY_TRACT | Status: DC | PRN

## 2015-05-21 MED ORDER — SODIUM CHLORIDE 0.9 % IN NEBU
INHALATION_SOLUTION | RESPIRATORY_TRACT | Status: AC
  Filled 2015-05-21: qty 3

## 2015-05-21 MED ORDER — EPINEPHRINE 0.3 MG/0.3ML IJ SOAJ: 0.3000 mg | Freq: Once | INTRAMUSCULAR | Status: AC

## 2015-05-21 NOTE — ED Notes (Signed)
Reports being ill 5 days ago with diarrhea and cold chills, felt better within a couple days.  Last night started with sensation that her throat is swollen "on the inside down low".  C/O feeling difficult to get air, having difficulty swallowing food and fluids.  Pt speaking without difficulty, no oral swelling noted.  Able to control oral secretions.  States she had to sit up to sleep last night due to "not being able to get enough air".

## 2015-05-21 NOTE — ED Notes (Signed)
Pt contemplating for several minutes whether she wants to accept the steroid shot.  Pt states she's going to see if the breathing tx works first.  Breathing treatment in progress.

## 2015-05-21 NOTE — ED Provider Notes (Signed)
HPI  SUBJECTIVE:  Marcia Walker is a 60 y.o. female who presents with coughing for the past 2 days, chest tightness, sensation of throat swelling shut starting last night. She states that she has trouble "pulling the air in" She reports some wheezing. States it feels like there is "phlegm covering the windpipe." Symptoms are better with staying still, worse with talking, taking deep breaths in. She has been taking Zyrtec and Delsym. She thinks these symptoms started after that these symptoms started after eating some yeast bread several days ago. She has discontinued her acid reflux medications. She has had flulike symptoms with diarrhea this week, has been taking Zyrtec and Delsym. No sore throat .  There are no other medication, new foods, new drinks. No nausea, vomiting, lip swelling, tongue swelling, other wheezing, rash, facial swelling. She denies diaphoresis, nausea, vomiting, other shortness of breath, radiation of her chest pain, exertional positional component of her chest pain. Past medical history of GERD. No history of MI, hypertension, asthma, emphysema, COPD, diabetes, tobacco use.  Past Medical History  Diagnosis Date  . Headache(784.0)     with allergies  . GERD (gastroesophageal reflux disease)     Past Surgical History  Procedure Laterality Date  . Right shoulder manipulation 03/2011  03/2011  . Left shouler manipulation  01/2010  . Other surgical history  15 yrs ago    cyst removed from end of spine  . Uterine fibroid surgery  2007  . I&d extremity  12/17/2011    Procedure: IRRIGATION AND DEBRIDEMENT EXTREMITY;  Surgeon: Marlowe Shores, MD;  Location: [facility] OR;  Service: Orthopedics;  Laterality: Right;  Right Ring Finger    No family history on file.  Social History  Substance Use Topics  . Smoking status: Never Smoker   . Smokeless tobacco: Never Used  . Alcohol Use: No    No current facility-administered medications for this encounter.  Current outpatient  prescriptions:  .  cetirizine (ZYRTEC) 10 MG tablet, Take 10 mg by mouth daily as needed. For allergies, Disp: , Rfl:  .  cycloSPORINE (RESTASIS) 0.05 % ophthalmic emulsion, Place 1 drop into both eyes at bedtime as needed (for allergy eye). , Disp: , Rfl:  .  naproxen sodium (ANAPROX) 220 MG tablet, Take 220 mg by mouth 2 (two) times daily as needed (for headache/pain.)., Disp: , Rfl:  .  pantoprazole (PROTONIX) 20 MG tablet, Take 1 tablet (20 mg total) by mouth daily., Disp: 20 tablet, Rfl: 0 .  polyethylene glycol (MIRALAX) packet, Take 17 g by mouth daily. Take up to 3 times daily until bowels move., Disp: 3 each, Rfl: 0 .  albuterol (PROVENTIL HFA;VENTOLIN HFA) 108 (90 Base) MCG/ACT inhaler, Inhale 1-2 puffs into the lungs every 6 (six) hours as needed for wheezing or shortness of breath., Disp: 1 Inhaler, Rfl: 0 .  EPINEPHrine 0.3 mg/0.3 mL IJ SOAJ injection, Inject 0.3 mLs (0.3 mg total) into the muscle once., Disp: 1 Device, Rfl: 1 .  sodium phosphate (FLEET) enema, Place 1 enema rectally daily as needed (for severe constipation). follow package directions, Disp: , Rfl:  .  Spacer/Aero-Holding Chambers (AEROCHAMBER PLUS) inhaler, Use as instructed, Disp: 1 each, Rfl: 2  Allergies  Allergen Reactions  . Prednisone Diarrhea    "moody, paranoid, diarrhea running out of me"  . Amitiza [Lubiprostone]     Chest pain  . Claritin [Loratadine] Other (See Comments)    hallucinations  . Darvocet [Propoxyphene N-Acetaminophen] Other (See Comments)    "  felt high as a kite"  . Other Hives    Acidic foods-  . Penicillins Other (See Comments)    Has patient had a PCN reaction causing immediate rash, facial/tongue/throat swelling, SOB or lightheadedness with hypotension:No Has patient had a PCN reaction causing severe rash involving mucus membranes or skin necrosis: No Has patient had a PCN reaction that required hospitalization:Yes Has patient had a PCN reaction occurring within the last 10  years:No If all of the above answers are "NO", then may proceed with Cephalosporin use. Hallucinations.  "makes pt crazy"  . Vibramycin [Doxycycline Monohydrate] Other (See Comments)    Made jaw twist and tongue felt like it was swelling     ROS  As noted in HPI.   Physical Exam  BP 136/66 mmHg  Pulse 70  Temp(Src) 99.2 F (37.3 C) (Oral)  SpO2 100%  Constitutional: Well developed, well nourished, no acute distress Eyes:  EOMI, conjunctiva normal bilaterally HENT: Normocephalic, atraumatic,mucus membranes moist. no drooling, stridor. No angioedema. No trismus. Oropharynx widely patent. Uvula midline. Oropharynx normal. Respiratory: Normal inspiratory effort, occasional wheezing. Some chest wall tenderness. Good air movement. Cardiovascular: Normal rate regular rhythm, no murmurs, rubs, gallops GI: nondistended skin: No rash, skin intact Musculoskeletal: no deformities Neurologic: Alert & oriented x 3, no focal neuro deficits Psychiatric: Speech and behavior appropriate   ED Course   Medications  ipratropium-albuterol (DUONEB) 0.5-2.5 (3) MG/3ML nebulizer solution 3 mL (3 mLs Nebulization Given 05/21/15 1450)    Orders Placed This Encounter  Procedures  . DG Neck Soft Tissue    Standing Status: Standing     Number of Occurrences: 1     Standing Expiration Date:     Order Specific Question:  Reason for Exam (SYMPTOM  OR DIAGNOSIS REQUIRED)    Answer:  hoarseness, sensation throat swelling shut r/o epiglottitis, FB, other obstruction  . ED EKG    Upper chest tightness    Standing Status: Standing     Number of Occurrences: 1     Standing Expiration Date:     Order Specific Question:  Reason for Exam    Answer:  Other (See Comments)  Dg Neck Soft Tissue  05/21/2015  CLINICAL DATA:  Throat swelling, pain and tightness.  Dysphagia EXAM: NECK SOFT TISSUES - 1+ VIEW COMPARISON:  None. FINDINGS: There is no evidence of retropharyngeal soft tissue swelling or epiglottic  enlargement. The cervical airway is unremarkable and no radio-opaque foreign body identified. 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 Upper lobes are clear. Upper thoracic scoliosis partially imaged. IMPRESSION: No acute finding by plain radiography. Electronically Signed   By: Judie Petit.  Shick M.D.   On: 05/21/2015 14:33     ED Clinical Impression  Bronchospasm  Allergic reaction, initial encounter   ED Assessment/Plan  Reviewed  imaging independently. No epiglottitis, foreign body noted.. See radiology report for full details.  EKG: Normal sinus rhythm, rate 63, normal axis, normal intervals, no ischemic changes, no ST-T wave changes, no previous EKG for comparison.  EKG was obtained as patient was reporting some chest pain described as upper chest tightness, this resolved with DuoNeb, feel this is more from bronchospasm rather than a cardiac cause.   Reevaluation after DuoNeb, patient states that she feels significantly better. She declined the Solu-Medrol shot. Repeat physical exam lungs clear, patient comfortable no drooling noted stridor no wheezing. Presentation is suggestive of an allergic reaction with some possible bronchospasm, there  is no evidence of anaphylaxis. We will send home with albuterol 2 puffs every 4-6 hours as needed, patient again declined prescription of steroids. We'll have her continue her antihistamine, and an H2 blocker for 5 days and send home with EpiPen.  Discussed  imaging, MDM, plan and followup with patient. Discussed sn/sx that should prompt return to the UC or ED. Patient agrees with plan.  *This clinic note was created using Dragon dictation software. Therefore, there may be occasional mistakes despite careful proofreading.  ?   [name], MD 05/21/15 [phone]

## 2015-05-21 NOTE — Discharge Instructions (Signed)
Take some Pepcid or Tagamet as directed on the box for the next 5 days. Continue Zyrtec for at least the next 5 days. 2 puffs from your albuterol inhaler every 4-6 hours as needed for coughing, wheezing, chest tightness. You may decrease the use of this as you  start to feel better. Go to the emergency room for the signs and symptoms we discussed

## 2015-08-03 ENCOUNTER — Encounter (HOSPITAL_COMMUNITY): Payer: Self-pay | Admitting: Emergency Medicine

## 2015-08-03 ENCOUNTER — Emergency Department (HOSPITAL_COMMUNITY): Payer: BLUE CROSS/BLUE SHIELD

## 2015-08-03 ENCOUNTER — Inpatient Hospital Stay (HOSPITAL_COMMUNITY)
Admission: EM | Admit: 2015-08-03 | Discharge: 2015-08-07 | DRG: 394 | Disposition: A | Discharge status: Home / Self Care | Payer: BLUE CROSS/BLUE SHIELD | Attending: Internal Medicine | Admitting: Internal Medicine

## 2015-08-03 DIAGNOSIS — R1031 Right lower quadrant pain: Secondary | ICD-10-CM | POA: Diagnosis present

## 2015-08-03 DIAGNOSIS — R509 Fever, unspecified: Secondary | ICD-10-CM | POA: Diagnosis not present

## 2015-08-03 DIAGNOSIS — K2100 Gastro-esophageal reflux disease with esophagitis, without bleeding: Secondary | ICD-10-CM

## 2015-08-03 DIAGNOSIS — K661 Hemoperitoneum: Secondary | ICD-10-CM | POA: Diagnosis not present

## 2015-08-03 DIAGNOSIS — Z79899 Other long term (current) drug therapy: Secondary | ICD-10-CM

## 2015-08-03 DIAGNOSIS — R109 Unspecified abdominal pain: Secondary | ICD-10-CM | POA: Diagnosis not present

## 2015-08-03 DIAGNOSIS — R197 Diarrhea, unspecified: Secondary | ICD-10-CM | POA: Diagnosis present

## 2015-08-03 DIAGNOSIS — Z888 Allergy status to other drugs, medicaments and biological substances status: Secondary | ICD-10-CM

## 2015-08-03 DIAGNOSIS — Z9889 Other specified postprocedural states: Secondary | ICD-10-CM

## 2015-08-03 DIAGNOSIS — D62 Acute posthemorrhagic anemia: Secondary | ICD-10-CM | POA: Diagnosis present

## 2015-08-03 DIAGNOSIS — Z881 Allergy status to other antibiotic agents status: Secondary | ICD-10-CM

## 2015-08-03 DIAGNOSIS — Z885 Allergy status to narcotic agent status: Secondary | ICD-10-CM

## 2015-08-03 DIAGNOSIS — K567 Ileus, unspecified: Secondary | ICD-10-CM

## 2015-08-03 DIAGNOSIS — K9189 Other postprocedural complications and disorders of digestive system: Secondary | ICD-10-CM

## 2015-08-03 DIAGNOSIS — Z880 Allergy status to penicillin: Secondary | ICD-10-CM

## 2015-08-03 DIAGNOSIS — Z91018 Allergy to other foods: Secondary | ICD-10-CM

## 2015-08-03 DIAGNOSIS — K5909 Other constipation: Secondary | ICD-10-CM | POA: Diagnosis present

## 2015-08-03 DIAGNOSIS — K219 Gastro-esophageal reflux disease without esophagitis: Secondary | ICD-10-CM | POA: Diagnosis present

## 2015-08-03 HISTORY — DX: Acute lymphangitis of right finger: L03.021

## 2015-08-03 LAB — CBC WITH DIFFERENTIAL/PLATELET
BASOS ABS: 0 10*3/uL (ref 0.0–0.1)
Basophils Relative: 0 %
EOS PCT: 0 %
Eosinophils Absolute: 0 10*3/uL (ref 0.0–0.7)
HCT: 31.2 % — ABNORMAL LOW (ref 36.0–46.0)
HEMOGLOBIN: 10.4 g/dL — AB (ref 12.0–15.0)
LYMPHS ABS: 0.6 10*3/uL — AB (ref 0.7–4.0)
LYMPHS PCT: 7 %
MCH: 29.7 pg (ref 26.0–34.0)
MCHC: 33.3 g/dL (ref 30.0–36.0)
MCV: 89.1 fL (ref 78.0–100.0)
Monocytes Absolute: 0.4 10*3/uL (ref 0.1–1.0)
Monocytes Relative: 5 %
NEUTROS ABS: 7.3 10*3/uL (ref 1.7–7.7)
NEUTROS PCT: 88 %
PLATELETS: 261 10*3/uL (ref 150–400)
RBC: 3.5 MIL/uL — AB (ref 3.87–5.11)
RDW: 13.7 % (ref 11.5–15.5)
WBC: 8.3 10*3/uL (ref 4.0–10.5)

## 2015-08-03 LAB — COMPREHENSIVE METABOLIC PANEL
ALK PHOS: 60 U/L (ref 38–126)
ALT: 20 U/L (ref 14–54)
AST: 23 U/L (ref 15–41)
Albumin: 3.8 g/dL (ref 3.5–5.0)
Anion gap: 8 (ref 5–15)
BUN: 8 mg/dL (ref 6–20)
CALCIUM: 8.9 mg/dL (ref 8.9–10.3)
CHLORIDE: 106 mmol/L (ref 101–111)
CO2: 25 mmol/L (ref 22–32)
CREATININE: 0.61 mg/dL (ref 0.44–1.00)
GFR calc Af Amer: >60 mL/min (ref >60)
Glucose, Bld: 129 mg/dL — ABNORMAL HIGH (ref 65–99)
Potassium: 3.8 mmol/L (ref 3.5–5.1)
Sodium: 139 mmol/L (ref 135–145)
Total Bilirubin: 0.6 mg/dL (ref 0.3–1.2)
Total Protein: 6.5 g/dL (ref 6.5–8.1)

## 2015-08-03 LAB — LIPASE, BLOOD: Lipase: 25 U/L (ref 11–51)

## 2015-08-03 MED ORDER — MORPHINE SULFATE (PF) 4 MG/ML IV SOLN
4.0000 mg | Freq: Once | INTRAVENOUS | Status: AC
  Administered 2015-08-03: 4 mg via INTRAVENOUS
  Filled 2015-08-03: qty 1

## 2015-08-03 MED ORDER — SODIUM CHLORIDE 0.9 % IV BOLUS (SEPSIS)
1000.0000 mL | Freq: Once | INTRAVENOUS | Status: AC
  Administered 2015-08-03: 1000 mL via INTRAVENOUS

## 2015-08-03 MED ORDER — IOPAMIDOL (ISOVUE-300) INJECTION 61%
100.0000 mL | Freq: Once | INTRAVENOUS | Status: AC | PRN
  Administered 2015-08-03: 100 mL via INTRAVENOUS

## 2015-08-03 MED ORDER — DIATRIZOATE MEGLUMINE & SODIUM 66-10 % PO SOLN
15.0000 mL | Freq: Once | ORAL | Status: AC
  Administered 2015-08-03: 15 mL via ORAL

## 2015-08-03 MED ORDER — ONDANSETRON HCL 4 MG/2ML IJ SOLN
4.0000 mg | Freq: Once | INTRAMUSCULAR | Status: AC
  Administered 2015-08-03: 4 mg via INTRAVENOUS
  Filled 2015-08-03: qty 2

## 2015-08-03 NOTE — ED Notes (Signed)
Pt states she had a colonscopy today by [facility] and is c/o pain in her abdomen, up under her ribs and into her shoulders  Pt states she has nausea and is unable to eat or drink anything  Pt states she is unable to sit up straight

## 2015-08-03 NOTE — ED Notes (Signed)
Patient transported to CT 

## 2015-08-03 NOTE — ED Provider Notes (Addendum)
CSN: [id number]     Arrival date & time 08/03/15  1923 History   First MD Initiated Contact with Patient 08/03/15 1956     Chief Complaint  Patient presents with  . Post-op Problem     (Consider location/radiation/quality/duration/timing/severity/associated sxs/prior Treatment) The history is provided by the patient.  Marcia Walker is a 60 y.o. female hx of GERD, Here presenting with pain after colonoscopy. Patient states that she had a routine colonoscopy today for screening by Dr. Randa Evens. After the colonoscopy she felt very bloated and distended has severe abdominal pain. She was in the postop area for many hours and still did not feel better but was sent home. She denies any vomiting but is still passing gas. She did drink GoLYTELY yesterday and had a huge bowel movement but none today.      Past Medical History  Diagnosis Date  . Headache(784.0)     with allergies  . GERD (gastroesophageal reflux disease)    Past Surgical History  Procedure Laterality Date  . Right shoulder manipulation 03/2011  03/2011  . Left shouler manipulation  01/2010  . Other surgical history  15 yrs ago    cyst removed from end of spine  . Uterine fibroid surgery  2007  . I&d extremity  12/17/2011    Procedure: IRRIGATION AND DEBRIDEMENT EXTREMITY;  Surgeon: Marlowe Shores, MD;  Location: [facility] OR;  Service: Orthopedics;  Laterality: Right;  Right Ring Finger   History reviewed. No pertinent family history. Social History  Substance Use Topics  . Smoking status: Never Smoker   . Smokeless tobacco: Never Used  . Alcohol Use: No   OB History    No data available     Review of Systems  Gastrointestinal: Positive for abdominal pain and abdominal distention.  All other systems reviewed and are negative.     Allergies  Prednisone; Amitiza; Claritin; Darvocet; Other; Penicillins; and Vibramycin  Home Medications   Prior to Admission medications   Medication Sig Start Date End Date Taking?  Authorizing Provider  cetirizine (ZYRTEC) 10 MG tablet Take 10 mg by mouth daily as needed. For allergies   Yes Historical Provider, MD  cycloSPORINE (RESTASIS) 0.05 % ophthalmic emulsion Place 1 drop into both eyes at bedtime as needed (for allergy eye).    Yes Historical Provider, MD  naproxen sodium (ANAPROX) 220 MG tablet Take 220 mg by mouth 2 (two) times daily as needed (for headache/pain.).   Yes Historical Provider, MD  polyethylene glycol (MIRALAX) packet Take 17 g by mouth daily. Take up to 3 times daily until bowels move. 02/27/15  Yes Shari Upstill, PA-C  albuterol (PROVENTIL HFA;VENTOLIN HFA) 108 (90 Base) MCG/ACT inhaler Inhale 1-2 puffs into the lungs every 6 (six) hours as needed for wheezing or shortness of breath. 05/21/15   [name], MD  EPINEPHrine 0.3 mg/0.3 mL IJ SOAJ injection Inject 0.3 mLs (0.3 mg total) into the muscle once. 05/21/15   [name], MD  pantoprazole (PROTONIX) 20 MG tablet Take 1 tablet (20 mg total) by mouth daily. 02/27/15   [name], PA-C  sodium phosphate (FLEET) enema Place 1 enema rectally daily as needed (for severe constipation). follow package directions    Historical Provider, MD  Spacer/Aero-Holding Chambers (AEROCHAMBER PLUS) inhaler Use as instructed 05/21/15   [name], MD   BP 128/60 mmHg  Pulse 87  Temp(Src) 98.1 F (36.7 C) (Oral)  Resp 18  SpO2 99% Physical Exam  Constitutional: She is oriented to person,  place, and time. She appears well-developed.  Uncomfortable   HENT:  Head: Normocephalic.  Eyes: Conjunctivae are normal. Pupils are equal, round, and reactive to light.  Neck: Normal range of motion. Neck supple.  Cardiovascular: Normal rate, regular rhythm and normal heart sounds.   Pulmonary/Chest: Effort normal and breath sounds normal. No respiratory distress. She has no wheezes. She has no rales.  Abdominal:  Distended, + fluid wave   Musculoskeletal: Normal range of motion.  Neurological: She is  alert and oriented to person, place, and time.  Skin: Skin is warm and dry.  Psychiatric: She has a normal mood and affect. Her behavior is normal. Judgment and thought content normal.  Nursing note and vitals reviewed.   ED Course  Procedures (including critical care time)  CRITICAL CARE Performed by: Silverio Lay, DAVID   Total critical care time: 30 minutes  Critical care time was exclusive of separately billable procedures and treating other patients.  Critical care was necessary to treat or prevent imminent or life-threatening deterioration.  Critical care was time spent personally by me on the following activities: development of treatment plan with patient and/or surrogate as well as nursing, discussions with consultants, evaluation of patient's response to treatment, examination of patient, obtaining history from patient or surrogate, ordering and performing treatments and interventions, ordering and review of laboratory studies, ordering and review of radiographic studies, pulse oximetry and re-evaluation of patient's condition.   Labs Review Labs Reviewed  CBC WITH DIFFERENTIAL/PLATELET - Abnormal; Notable for the following:    RBC 3.50 (*)    Hemoglobin 10.4 (*)    HCT 31.2 (*)    Lymphs Abs 0.6 (*)    All other components within normal limits  COMPREHENSIVE METABOLIC PANEL - Abnormal; Notable for the following:    Glucose, Bld 129 (*)    All other components within normal limits  LIPASE, BLOOD  CBC WITH DIFFERENTIAL/PLATELET  PROTIME-INR  TYPE AND SCREEN    Imaging Review Ct Abdomen Pelvis W Contrast  08/03/2015  CLINICAL DATA:  Abdominal pain after colonoscopy today. EXAM: CT ABDOMEN AND PELVIS WITH CONTRAST TECHNIQUE: Multidetector CT imaging of the abdomen and pelvis was performed using the standard protocol following bolus administration of intravenous contrast. CONTRAST:  ISOVUE-300 IOPAMIDOL (ISOVUE-300) INJECTION 61% COMPARISON:  Radiographs 08/03/2015  FINDINGS: There is a large volume peritoneal fluid with high density material collected dependently, characteristic of hemo peritoneum. The source of this presumed blood is not conclusive. There is a suggestion of a contrast blush in the right hemipelvis on coronal image 83 series 4 and on axial image 62 series 2 by a bleeding source is not visible. There is no extraluminal air. The spleen is intact. The liver is intact. Pancreas, adrenals and kidneys are unremarkable. Aorta and IVC are intact. Stomach and small bowel are unremarkable. No focal colonic abnormality is evident. No significant abnormality in the lower chest. No significant musculoskeletal abnormality. IMPRESSION: Large volume hemoperitoneum. No extraluminal air. Source of the presumed bleeding is not evident. These results were called by telephone at the time of interpretation on 08/03/2015 at 11:25 pm to the ER attending physician, who verbally acknowledged these results. Electronically Signed   By: Ellery Plunk M.D.   On: 08/03/2015 23:29   Dg Abd Acute W/chest  08/03/2015  CLINICAL DATA:  Colonoscopy earlier today now with generalized abdominal pain. EXAM: DG ABDOMEN ACUTE W/ 1V CHEST COMPARISON:  02/26/2015 FINDINGS: Normal cardiac silhouette and mediastinal contours given patient rotation. There is a punctate (approximately  5 mm) apparent nodule overlying the peripheral aspect of the right upper/mid lung. No focal airspace opacities. No pleural effusion or pneumothorax. No evidence of edema. Nonobstructive bowel gas pattern. No pneumoperitoneum, pneumatosis or portal venous gas. Surgical clip overlies the pelvis, potentially the sequela of prior tubal ligation, unchanged. Multiple phleboliths overlie the lower pelvis bilaterally. Otherwise, no definitive abnormal intra-abdominal calcifications. No acute osseus abnormalities. IMPRESSION: 1. No evidence of complication following recent colonoscopy. Specifically, no evidence of pneumoperitoneum.  2. No acute cardiopulmonary disease. 3. Potential 5 mm nodule overlying the peripheral aspect of the right upper/mid lung, potentially artifactual due to complex of overlying osseous structures. Correlation with prior remote examinations (if available) is recommended. Otherwise follow-up PA and lateral chest radiographs in 4-6 weeks is recommended. Electronically Signed   By: Simonne ComeJohn  Watts M.D.   On: 08/03/2015 21:01   I have personally reviewed and evaluated these images and lab results as part of my medical decision-making.   EKG Interpretation None      MDM   Final diagnoses:  None   Marcia Walker is a 60 y.o. female here with abdominal pain and distension s/p colonoscopy. Will get labs, xray to r/o free air.   9 pm Xray showed no free air. Still in pain. Will get CT.   11:30 pm CT showed hemoperitoneum. I am concerned it may be from the procedure. Consulted Dr. Michaell CowingGross from surgery, who wants me to consult GI.   12 am Consulted Dr. Madilyn FiremanHayes from GI, who is covering Dr. Randa EvensEdwards. He states that he is unable to explore the source of bleeding and recommend surgery evaluation. He will see patient in the morning. I called Dr. Michaell CowingGross back and he will see patient tonight. He wants medicine admission. He wants to get repeat CBC.   12:22 AM Talked with Dr. Lovell SheehanJenkins from [facility]. She states that its a surgical issue and she will not admit the patient. Dr. Michaell CowingGross to see patient.   12:45 am Dr. Michaell CowingGross saw patient. I had extensive discussion with him and with Dr. Lovell SheehanJenkins. He doesn't plan on doing surgery tonight and doesn't think the free fluid is related to the colonoscopy as he has no obvious pneumoperitoneum or perforation. Dr. Lovell SheehanJenkins to admit to stepdown. Surgery will remain on consult.    Richardean Canalavid H Yao, MD 08/04/15 [place]0023  Richardean Canalavid H Yao, MD 08/04/15 [phone]

## 2015-08-04 ENCOUNTER — Observation Stay (HOSPITAL_COMMUNITY): Payer: BLUE CROSS/BLUE SHIELD

## 2015-08-04 ENCOUNTER — Encounter (HOSPITAL_COMMUNITY): Payer: Self-pay | Admitting: Surgery

## 2015-08-04 DIAGNOSIS — Z888 Allergy status to other drugs, medicaments and biological substances status: Secondary | ICD-10-CM | POA: Diagnosis not present

## 2015-08-04 DIAGNOSIS — Z880 Allergy status to penicillin: Secondary | ICD-10-CM | POA: Diagnosis not present

## 2015-08-04 DIAGNOSIS — R109 Unspecified abdominal pain: Secondary | ICD-10-CM | POA: Diagnosis present

## 2015-08-04 DIAGNOSIS — K661 Hemoperitoneum: Principal | ICD-10-CM

## 2015-08-04 DIAGNOSIS — K2100 Gastro-esophageal reflux disease with esophagitis, without bleeding: Secondary | ICD-10-CM | POA: Insufficient documentation

## 2015-08-04 DIAGNOSIS — R509 Fever, unspecified: Secondary | ICD-10-CM | POA: Diagnosis not present

## 2015-08-04 DIAGNOSIS — Z91018 Allergy to other foods: Secondary | ICD-10-CM | POA: Diagnosis not present

## 2015-08-04 DIAGNOSIS — R197 Diarrhea, unspecified: Secondary | ICD-10-CM | POA: Diagnosis present

## 2015-08-04 DIAGNOSIS — Z79899 Other long term (current) drug therapy: Secondary | ICD-10-CM | POA: Diagnosis not present

## 2015-08-04 DIAGNOSIS — Z885 Allergy status to narcotic agent status: Secondary | ICD-10-CM | POA: Diagnosis not present

## 2015-08-04 DIAGNOSIS — R1031 Right lower quadrant pain: Secondary | ICD-10-CM | POA: Diagnosis present

## 2015-08-04 DIAGNOSIS — Z9889 Other specified postprocedural states: Secondary | ICD-10-CM | POA: Diagnosis not present

## 2015-08-04 DIAGNOSIS — Z881 Allergy status to other antibiotic agents status: Secondary | ICD-10-CM | POA: Diagnosis not present

## 2015-08-04 DIAGNOSIS — K5909 Other constipation: Secondary | ICD-10-CM | POA: Diagnosis present

## 2015-08-04 DIAGNOSIS — K219 Gastro-esophageal reflux disease without esophagitis: Secondary | ICD-10-CM | POA: Diagnosis present

## 2015-08-04 DIAGNOSIS — D62 Acute posthemorrhagic anemia: Secondary | ICD-10-CM | POA: Diagnosis present

## 2015-08-04 LAB — CBC WITH DIFFERENTIAL/PLATELET
BASOS ABS: 0 10*3/uL (ref 0.0–0.1)
BASOS PCT: 0 %
EOS ABS: 0 10*3/uL (ref 0.0–0.7)
Eosinophils Relative: 0 %
HEMATOCRIT: 30.6 % — AB (ref 36.0–46.0)
Hemoglobin: 10.1 g/dL — ABNORMAL LOW (ref 12.0–15.0)
Lymphocytes Relative: 10 %
Lymphs Abs: 0.8 10*3/uL (ref 0.7–4.0)
MCH: 29.5 pg (ref 26.0–34.0)
MCHC: 33 g/dL (ref 30.0–36.0)
MCV: 89.5 fL (ref 78.0–100.0)
MONO ABS: 0.5 10*3/uL (ref 0.1–1.0)
Monocytes Relative: 6 %
NEUTROS ABS: 6.7 10*3/uL (ref 1.7–7.7)
NEUTROS PCT: 84 %
Platelets: 223 10*3/uL (ref 150–400)
RBC: 3.42 MIL/uL — ABNORMAL LOW (ref 3.87–5.11)
RDW: 13.8 % (ref 11.5–15.5)
WBC: 8 10*3/uL (ref 4.0–10.5)

## 2015-08-04 LAB — BASIC METABOLIC PANEL
Anion gap: 7 (ref 5–15)
BUN: 5 mg/dL — AB (ref 6–20)
CHLORIDE: 108 mmol/L (ref 101–111)
CO2: 25 mmol/L (ref 22–32)
CREATININE: 0.61 mg/dL (ref 0.44–1.00)
Calcium: 8.2 mg/dL — ABNORMAL LOW (ref 8.9–10.3)
Glucose, Bld: 103 mg/dL — ABNORMAL HIGH (ref 65–99)
POTASSIUM: 3.7 mmol/L (ref 3.5–5.1)
SODIUM: 140 mmol/L (ref 135–145)

## 2015-08-04 LAB — CBC
HEMATOCRIT: 23.9 % — AB (ref 36.0–46.0)
HEMATOCRIT: 26.7 % — AB (ref 36.0–46.0)
HEMATOCRIT: 25.1 % — AB (ref 36.0–46.0)
HEMOGLOBIN: 8.8 g/dL — AB (ref 12.0–15.0)
HEMOGLOBIN: 8.3 g/dL — AB (ref 12.0–15.0)
Hemoglobin: 7.6 g/dL — ABNORMAL LOW (ref 12.0–15.0)
MCH: 29.3 pg (ref 26.0–34.0)
MCH: 29.9 pg (ref 26.0–34.0)
MCH: 29.1 pg (ref 26.0–34.0)
MCHC: 32.5 g/dL (ref 30.0–36.0)
MCHC: 33 g/dL (ref 30.0–36.0)
MCHC: 33.1 g/dL (ref 30.0–36.0)
MCV: 90.3 fL (ref 78.0–100.0)
MCV: 90.8 fL (ref 78.0–100.0)
MCV: 88.1 fL (ref 78.0–100.0)
PLATELETS: 207 10*3/uL (ref 150–400)
PLATELETS: 200 10*3/uL (ref 150–400)
Platelets: 206 10*3/uL (ref 150–400)
RBC: 2.59 MIL/uL — AB (ref 3.87–5.11)
RBC: 2.94 MIL/uL — ABNORMAL LOW (ref 3.87–5.11)
RBC: 2.85 MIL/uL — AB (ref 3.87–5.11)
RDW: 14 % (ref 11.5–15.5)
RDW: 14 % (ref 11.5–15.5)
RDW: 13.7 % (ref 11.5–15.5)
WBC: 5.2 10*3/uL (ref 4.0–10.5)
WBC: 5 10*3/uL (ref 4.0–10.5)
WBC: 5.2 10*3/uL (ref 4.0–10.5)

## 2015-08-04 LAB — PREPARE RBC (CROSSMATCH)

## 2015-08-04 LAB — PROTIME-INR
INR: 1.14 (ref 0.00–1.49)
Prothrombin Time: 14.3 seconds (ref 11.6–15.2)

## 2015-08-04 LAB — MRSA PCR SCREENING: MRSA by PCR: NEGATIVE

## 2015-08-04 LAB — ABO/RH: ABO/RH(D): A POS

## 2015-08-04 MED ORDER — LACTATED RINGERS IV BOLUS (SEPSIS): 1000.0000 mL | Freq: Three times a day (TID) | INTRAVENOUS | Status: DC | PRN

## 2015-08-04 MED ORDER — HYDROMORPHONE HCL 1 MG/ML IJ SOLN
0.5000 mg | INTRAMUSCULAR | Status: DC | PRN
  Administered 2015-08-04: 1 mg via INTRAVENOUS
  Filled 2015-08-04 (×2): qty 1

## 2015-08-04 MED ORDER — ONDANSETRON HCL 4 MG/2ML IJ SOLN: 4.0000 mg | Freq: Four times a day (QID) | INTRAMUSCULAR | Status: DC | PRN

## 2015-08-04 MED ORDER — ONDANSETRON HCL 4 MG/2ML IJ SOLN
4.0000 mg | Freq: Four times a day (QID) | INTRAMUSCULAR | Status: DC | PRN
  Administered 2015-08-05: 4 mg via INTRAVENOUS
  Filled 2015-08-04: qty 2

## 2015-08-04 MED ORDER — MAGIC MOUTHWASH
15.0000 mL | Freq: Four times a day (QID) | ORAL | Status: DC | PRN
  Filled 2015-08-04: qty 15

## 2015-08-04 MED ORDER — SODIUM CHLORIDE 0.9 % IV SOLN: Freq: Once | INTRAVENOUS | Status: DC

## 2015-08-04 MED ORDER — CYCLOSPORINE 0.05 % OP EMUL
1.0000 [drp] | Freq: Every evening | OPHTHALMIC | Status: DC | PRN
  Filled 2015-08-04: qty 1

## 2015-08-04 MED ORDER — DIPHENHYDRAMINE HCL 50 MG/ML IJ SOLN
12.5000 mg | Freq: Four times a day (QID) | INTRAMUSCULAR | Status: DC | PRN
Start: 2015-08-04 — End: 2015-08-07
  Administered 2015-08-04: 25 mg via INTRAVENOUS
  Filled 2015-08-04: qty 1

## 2015-08-04 MED ORDER — METRONIDAZOLE IN NACL 5-0.79 MG/ML-% IV SOLN
500.0000 mg | Freq: Three times a day (TID) | INTRAVENOUS | Status: DC
  Administered 2015-08-04 – 2015-08-06 (×6): 500 mg via INTRAVENOUS
  Filled 2015-08-04 (×6): qty 100

## 2015-08-04 MED ORDER — ACETAMINOPHEN 325 MG PO TABS
325.0000 mg | ORAL_TABLET | Freq: Four times a day (QID) | ORAL | Status: DC | PRN
  Administered 2015-08-04: 325 mg via ORAL
  Filled 2015-08-04: qty 1

## 2015-08-04 MED ORDER — LACTATED RINGERS IV BOLUS (SEPSIS)
1000.0000 mL | Freq: Once | INTRAVENOUS | Status: AC
  Administered 2015-08-04: 1000 mL via INTRAVENOUS

## 2015-08-04 MED ORDER — DEXTROSE 5 % IV SOLN: 2.0000 g | INTRAVENOUS | Status: DC

## 2015-08-04 MED ORDER — EPINEPHRINE 0.3 MG/0.3ML IJ SOAJ: 0.3000 mg | Freq: Once | INTRAMUSCULAR | Status: DC

## 2015-08-04 MED ORDER — SODIUM CHLORIDE 0.9 % IV SOLN
INTRAVENOUS | Status: AC
  Administered 2015-08-04: 07:00:00 via INTRAVENOUS

## 2015-08-04 MED ORDER — PHENOL 1.4 % MT LIQD
2.0000 | OROMUCOSAL | Status: DC | PRN
  Filled 2015-08-04: qty 177

## 2015-08-04 MED ORDER — ONDANSETRON HCL 4 MG PO TABS: 4.0000 mg | ORAL_TABLET | Freq: Four times a day (QID) | ORAL | Status: DC | PRN

## 2015-08-04 MED ORDER — CIPROFLOXACIN IN D5W 400 MG/200ML IV SOLN
400.0000 mg | Freq: Two times a day (BID) | INTRAVENOUS | Status: DC
  Administered 2015-08-04 – 2015-08-06 (×5): 400 mg via INTRAVENOUS
  Filled 2015-08-04 (×5): qty 200

## 2015-08-04 MED ORDER — PANTOPRAZOLE SODIUM 40 MG IV SOLR
40.0000 mg | Freq: Two times a day (BID) | INTRAVENOUS | Status: DC
Start: 2015-08-04 — End: 2015-08-07
  Administered 2015-08-04 – 2015-08-06 (×6): 40 mg via INTRAVENOUS
  Filled 2015-08-04 (×8): qty 40

## 2015-08-04 MED ORDER — SODIUM CHLORIDE 0.9 % IV SOLN
INTRAVENOUS | Status: DC
  Administered 2015-08-04 – 2015-08-05 (×2): via INTRAVENOUS

## 2015-08-04 MED ORDER — ALUM & MAG HYDROXIDE-SIMETH 200-200-20 MG/5ML PO SUSP: 30.0000 mL | Freq: Four times a day (QID) | ORAL | Status: DC | PRN

## 2015-08-04 MED ORDER — METOPROLOL TARTRATE 25 MG PO TABS: 12.5000 mg | ORAL_TABLET | Freq: Two times a day (BID) | ORAL | Status: DC | PRN

## 2015-08-04 MED ORDER — LACTATED RINGERS IV BOLUS (SEPSIS): 1000.0000 mL | Freq: Three times a day (TID) | INTRAVENOUS | Status: AC | PRN

## 2015-08-04 MED ORDER — LIP MEDEX EX OINT
1.0000 "application " | TOPICAL_OINTMENT | Freq: Two times a day (BID) | CUTANEOUS | Status: DC
  Administered 2015-08-04 – 2015-08-06 (×5): 1 via TOPICAL
  Filled 2015-08-04 (×2): qty 7

## 2015-08-04 MED ORDER — ACETAMINOPHEN 325 MG PO TABS
650.0000 mg | ORAL_TABLET | Freq: Four times a day (QID) | ORAL | Status: DC | PRN
  Administered 2015-08-04 – 2015-08-07 (×6): 650 mg via ORAL
  Filled 2015-08-04 (×6): qty 2

## 2015-08-04 MED ORDER — METRONIDAZOLE IN NACL 5-0.79 MG/ML-% IV SOLN
500.0000 mg | Freq: Four times a day (QID) | INTRAVENOUS | Status: DC
  Administered 2015-08-04 (×2): 500 mg via INTRAVENOUS
  Filled 2015-08-04 (×2): qty 100

## 2015-08-04 MED ORDER — ACETAMINOPHEN 650 MG RE SUPP
650.0000 mg | Freq: Four times a day (QID) | RECTAL | Status: DC | PRN
Start: 2015-08-04 — End: 2015-08-07

## 2015-08-04 MED ORDER — MENTHOL 3 MG MT LOZG
1.0000 | LOZENGE | OROMUCOSAL | Status: DC | PRN
  Filled 2015-08-04: qty 9

## 2015-08-04 MED ORDER — SODIUM CHLORIDE 0.9 % IV SOLN
8.0000 mg | Freq: Four times a day (QID) | INTRAVENOUS | Status: DC | PRN
  Filled 2015-08-04: qty 4

## 2015-08-04 MED ORDER — SODIUM CHLORIDE 0.9 % IV BOLUS (SEPSIS)
500.0000 mL | Freq: Once | INTRAVENOUS | Status: AC
  Administered 2015-08-04: 500 mL via INTRAVENOUS

## 2015-08-04 MED ORDER — DEXTROSE 5 % IV SOLN
1000.0000 mg | Freq: Four times a day (QID) | INTRAVENOUS | Status: DC | PRN
  Filled 2015-08-04: qty 10

## 2015-08-04 MED ORDER — PROCHLORPERAZINE EDISYLATE 5 MG/ML IJ SOLN: 5.0000 mg | INTRAMUSCULAR | Status: DC | PRN

## 2015-08-04 MED ORDER — HYDROMORPHONE HCL 1 MG/ML IJ SOLN
0.5000 mg | INTRAMUSCULAR | Status: DC | PRN
  Administered 2015-08-05: 0.5 mg via INTRAVENOUS
  Filled 2015-08-04: qty 1

## 2015-08-04 NOTE — Consult Note (Signed)
[facility] CON[facility] Reason for consult: Hemoperitoneum Referring Physician: Dr Kristen Loader is an 60 y.o. female.  HPI: Pt that I follow for chronic constipation. She had her first colonoscopy yesterday. This was done for screening purposes. The procedure went very smoothly with no obvious problems. No polyps were removed and no biopsies were taken. There was no diverticulosis in the colon. She had difficulty passing air after the procedure but was able to pass some of the air before she left the unit and reported that her pain at improved by about 50%. She went home and continued to have pain and discomfort and came to the emergency room. Her WBC was normal hemoglobin was low. Acute abdominal series showed no pneumoperitoneum and CT scan was obtained showing no pneumoperitoneum in a normal colon and small bowel, normal spleen but increased fluid consistent with hemoperitoneum. There were no other significant abnormalities. The patient has received fluids and pain medication and feels better this morning  Past Medical History  Diagnosis Date  . Headache(784.0)     with allergies  . GERD (gastroesophageal reflux disease)   . Felon of finger of right hand with lymphangitis 12/17/2011    Past Surgical History  Procedure Laterality Date  . Right shoulder manipulation 03/2011  03/2011  . Left shouler manipulation  01/2010  . Other surgical history  15 yrs ago    cyst removed from end of spine  . Uterine fibroid surgery  2007  . I&d extremity  12/17/2011    Procedure: IRRIGATION AND DEBRIDEMENT EXTREMITY;  Surgeon: Schuyler Amor, MD;  Location: [facility];  Service: Orthopedics;  Laterality: Right;  Right Ring Finger    History reviewed. No pertinent family history.  Social History:  reports that she has never smoked. She has never used smokeless tobacco. She reports that she does not drink alcohol or use illicit drugs.  Allergies:  Allergies  Allergen Reactions  .  Prednisone Diarrhea    "moody, paranoid, diarrhea running out of me"  . Amitiza [Lubiprostone]     Chest pain  . Claritin [Loratadine] Other (See Comments)    hallucinations  . Other Hives    Acidic foods-  . Penicillins Other (See Comments)    Has patient had a PCN reaction causing immediate rash, facial/tongue/throat swelling, SOB or lightheadedness with hypotension:No Has patient had a PCN reaction causing severe rash involving mucus membranes or skin necrosis: No Has patient had a PCN reaction that required hospitalization:Yes Has patient had a PCN reaction occurring within the last 10 years:No If all of the above answers are "NO", then may proceed with Cephalosporin use. Hallucinations.  "makes pt crazy"  . Propoxyphene Other (See Comments)    "high as a kite"  . Vibramycin [Doxycycline Monohydrate] Other (See Comments)    Made jaw twist and tongue felt like it was swelling    Medications; Prior to Admission medications   Medication Sig Start Date End Date Taking? Authorizing Provider  cetirizine (ZYRTEC) 10 MG tablet Take 10 mg by mouth daily as needed. For allergies   Yes Historical Provider, MD  cycloSPORINE (RESTASIS) 0.05 % ophthalmic emulsion Place 1 drop into both eyes at bedtime as needed (for allergy eye).    Yes Historical Provider, MD  naproxen sodium (ANAPROX) 220 MG tablet Take 220 mg by mouth 2 (two) times daily as needed (for headache/pain.).   Yes Historical Provider, MD  polyethylene glycol (MIRALAX) packet Take 17 g by mouth daily. Take up to 3  times daily until bowels move. 02/27/15  Yes Shari Upstill, PA-C  albuterol (PROVENTIL HFA;VENTOLIN HFA) 108 (90 Base) MCG/ACT inhaler Inhale 1-2 puffs into the lungs every 6 (six) hours as needed for wheezing or shortness of breath. 05/21/15   Melynda Ripple, MD  EPINEPHrine 0.3 mg/0.3 mL IJ SOAJ injection Inject 0.3 mLs (0.3 mg total) into the muscle once. 05/21/15   Melynda Ripple, MD  pantoprazole (PROTONIX) 20 MG  tablet Take 1 tablet (20 mg total) by mouth daily. 02/27/15   Charlann Lange, PA-C  sodium phosphate (FLEET) enema Place 1 enema rectally daily as needed (for severe constipation). follow package directions    Historical Provider, MD  Spacer/Aero-Holding Chambers (AEROCHAMBER PLUS) inhaler Use as instructed 05/21/15   Melynda Ripple, MD   . EPINEPHrine  0.3 mg Intramuscular Once  . lip balm  1 application Topical BID  . pantoprazole (PROTONIX) IV  40 mg Intravenous Q12H   PRN Meds acetaminophen **OR** acetaminophen, acetaminophen, cycloSPORINE, diphenhydrAMINE, HYDROmorphone (DILAUDID) injection, HYDROmorphone (DILAUDID) injection, lactated ringers, lactated ringers, magic mouthwash, menthol-cetylpyridinium, methocarbamol (ROBAXIN)  IV, metoprolol tartrate, ondansetron (ZOFRAN) IV **OR** ondansetron (ZOFRAN) IV, ondansetron **OR** ondansetron (ZOFRAN) IV, phenol, prochlorperazine Results for orders placed or performed during the hospital encounter of 08/03/15 (from the past 48 hour(s))  CBC with Differential     Status: Abnormal   Collection Time: 08/03/15  8:35 PM  Result Value Ref Range   WBC 8.3 4.0 - 10.5 K/uL   RBC 3.50 (L) 3.87 - 5.11 MIL/uL   Hemoglobin 10.4 (L) 12.0 - 15.0 g/dL   HCT 31.2 (L) 36.0 - 46.0 %   MCV 89.1 78.0 - 100.0 fL   MCH 29.7 26.0 - 34.0 pg   MCHC 33.3 30.0 - 36.0 g/dL   RDW 13.7 11.5 - 15.5 %   Platelets 261 150 - 400 K/uL   Neutrophils Relative % 88 %   Neutro Abs 7.3 1.7 - 7.7 K/uL   Lymphocytes Relative 7 %   Lymphs Abs 0.6 (L) 0.7 - 4.0 K/uL   Monocytes Relative 5 %   Monocytes Absolute 0.4 0.1 - 1.0 K/uL   Eosinophils Relative 0 %   Eosinophils Absolute 0.0 0.0 - 0.7 K/uL   Basophils Relative 0 %   Basophils Absolute 0.0 0.0 - 0.1 K/uL  Comprehensive metabolic panel     Status: Abnormal   Collection Time: 08/03/15  8:35 PM  Result Value Ref Range   Sodium 139 135 - 145 mmol/L   Potassium 3.8 3.5 - 5.1 mmol/L   Chloride 106 101 - 111 mmol/L   CO2  25 22 - 32 mmol/L   Glucose, Bld 129 (H) 65 - 99 mg/dL   BUN 8 6 - 20 mg/dL   Creatinine, Ser 0.61 0.44 - 1.00 mg/dL   Calcium 8.9 8.9 - 10.3 mg/dL   Total Protein 6.5 6.5 - 8.1 g/dL   Albumin 3.8 3.5 - 5.0 g/dL   AST 23 15 - 41 U/L   ALT 20 14 - 54 U/L   Alkaline Phosphatase 60 38 - 126 U/L   Total Bilirubin 0.6 0.3 - 1.2 mg/dL   GFR calc non Af Amer >60 >60 mL/min   GFR calc Af Amer >60 >60 mL/min    Comment: (NOTE) The eGFR has been calculated using the CKD EPI equation. This calculation has not been validated in all clinical situations. eGFR's persistently <60 mL/min signify possible Chronic Kidney Disease.    Anion gap 8 5 - 15  Lipase, blood  Status: None   Collection Time: 08/03/15  8:35 PM  Result Value Ref Range   Lipase 25 11 - 51 U/L  Type and screen     Status: None   Collection Time: 08/03/15 11:52 PM  Result Value Ref Range   ABO/RH(D) A POS    Antibody Screen NEG    Sample Expiration 08/06/2015   CBC with Differential     Status: Abnormal   Collection Time: 08/03/15 11:52 PM  Result Value Ref Range   WBC 8.0 4.0 - 10.5 K/uL   RBC 3.42 (L) 3.87 - 5.11 MIL/uL   Hemoglobin 10.1 (L) 12.0 - 15.0 g/dL   HCT 30.6 (L) 36.0 - 46.0 %   MCV 89.5 78.0 - 100.0 fL   MCH 29.5 26.0 - 34.0 pg   MCHC 33.0 30.0 - 36.0 g/dL   RDW 13.8 11.5 - 15.5 %   Platelets 223 150 - 400 K/uL   Neutrophils Relative % 84 %   Neutro Abs 6.7 1.7 - 7.7 K/uL   Lymphocytes Relative 10 %   Lymphs Abs 0.8 0.7 - 4.0 K/uL   Monocytes Relative 6 %   Monocytes Absolute 0.5 0.1 - 1.0 K/uL   Eosinophils Relative 0 %   Eosinophils Absolute 0.0 0.0 - 0.7 K/uL   Basophils Relative 0 %   Basophils Absolute 0.0 0.0 - 0.1 K/uL  Protime-INR     Status: None   Collection Time: 08/03/15 11:52 PM  Result Value Ref Range   Prothrombin Time 14.3 11.6 - 15.2 seconds   INR 1.14 0.00 - 1.49  ABO/Rh     Status: None   Collection Time: 08/03/15 11:52 PM  Result Value Ref Range   ABO/RH(D) A POS      Ct Abdomen Pelvis W Contrast  08/03/2015  CLINICAL DATA:  Abdominal pain after colonoscopy today. EXAM: CT ABDOMEN AND PELVIS WITH CONTRAST TECHNIQUE: Multidetector CT imaging of the abdomen and pelvis was performed using the standard protocol following bolus administration of intravenous contrast. CONTRAST:  149m ISOVUE-300 IOPAMIDOL (ISOVUE-300) INJECTION 61% COMPARISON:  Radiographs 08/03/2015 FINDINGS: There is a large volume peritoneal fluid with high density material collected dependently, characteristic of hemo peritoneum. The source of this presumed blood is not conclusive. There is a suggestion of a contrast blush in the right hemipelvis on coronal image 83 series 4 and on axial image 62 series 2 by a bleeding source is not visible. There is no extraluminal air. The spleen is intact. The liver is intact. Pancreas, adrenals and kidneys are unremarkable. Aorta and IVC are intact. Stomach and small bowel are unremarkable. No focal colonic abnormality is evident. No significant abnormality in the lower chest. No significant musculoskeletal abnormality. IMPRESSION: Large volume hemoperitoneum. No extraluminal air. Source of the presumed bleeding is not evident. These results were called by telephone at the time of interpretation on 08/03/2015 at 11:25 pm to the ER attending physician, who verbally acknowledged these results. Electronically Signed   By: DAndreas NewportM.D.   On: 08/03/2015 23:29   Dg Abd Acute W/chest  08/03/2015  CLINICAL DATA:  Colonoscopy earlier today now with generalized abdominal pain. EXAM: DG ABDOMEN ACUTE W/ 1V CHEST COMPARISON:  02/26/2015 FINDINGS: Normal cardiac silhouette and mediastinal contours given patient rotation. There is a punctate (approximately 5 mm) apparent nodule overlying the peripheral aspect of the right upper/mid lung. No focal airspace opacities. No pleural effusion or pneumothorax. No evidence of edema. Nonobstructive bowel gas pattern. No  pneumoperitoneum, pneumatosis or portal  venous gas. Surgical clip overlies the pelvis, potentially the sequela of prior tubal ligation, unchanged. Multiple phleboliths overlie the lower pelvis bilaterally. Otherwise, no definitive abnormal intra-abdominal calcifications. No acute osseus abnormalities. IMPRESSION: 1. No evidence of complication following recent colonoscopy. Specifically, no evidence of pneumoperitoneum. 2. No acute cardiopulmonary disease. 3. Potential 5 mm nodule overlying the peripheral aspect of the right upper/mid lung, potentially artifactual due to complex of overlying osseous structures. Correlation with prior remote examinations (if available) is recommended. Otherwise follow-up PA and lateral chest radiographs in 4-6 weeks is recommended. Electronically Signed   By: Sandi Mariscal M.D.   On: 08/03/2015 21:01   e:            Blood pressure 83/52, pulse 61, temperature 98.1 F (36.7 C), temperature source Oral, resp. rate 16, SpO2 100 %.  Physical exam:   General-- sleepy but in no distress  Lungs-- clear Abdomen-- nondistended compared to yesterday after the colonoscopy. Excellent bowel sounds mild tenderness primarily on the right side    Assessment: 1. Hemoperitoneum. This occurred after uneventful colonoscopy. Fortunately, there does not appear to be any damage at all to the colon. No pneumoperitoneum etc. No biopsies were obtained and no polyps were removed. Hopefully the bleeding has stopped and the hemoperitoneum will reabsorb with conservative therapy  2. Chronic constipation.   Plan: 1. Agree with Dr. Johney Maine this does not appear to be a surgical abdomen at this time. Hopefully pain management and conservative therapy will allow reabsorption the intra-abdominal bleeding. Would keep NPO today and if hemoglobin stable and pain slowly improves consider begining clear liquids.  We will follow with you    Rosbel Buckner JR,Aseret Hoffman L 08/04/2015, 6:52 AM   This note was  created using voice recognition software and minor errors may Have occurred unintentionally. Pager: [phone] If no answer or after hours call [phone]

## 2015-08-04 NOTE — Progress Notes (Signed)
PROGRESS NOTE    Marcia Walker  UJW:[id number] DOB: 1955/12/27 DOA: 08/03/2015 PCP: No primary care provider on file. Outpatient Specialists: Gi Dr.Edwards  Brief Narrative: Marcia Walker is a 60 y.o. female with a history of GERD who had a Colonoscopy done 08/03/2015 by Dr Randa EvensEdwards for screening purposes who presented to the ED with complaints of increased diffuse ABD pain which began after the procedure.CT scan of the ABD were performed and the CT scan was suggestive of a Large volume Hemoperitoneum extraluminal free air was not seen   Assessment & Plan:  Hemoperitoneum -post Colonoscopy, etiology unclear -GI and CCS consulting -Continue IVF and Abx -diet advanced per GI -Hb dropping and BP soft -will need transfusion later today  Acute Blood loss anemia -due to above, Hb down from baseline of 13-> 10.4 -> 7.6  GERD (gastroesophageal reflux disease) -PPI    DVT prophylaxis:SCDs  Code Status: Full Code Family Communication: none at bedside Disposition Plan: admit to SDU   Consultants:  Gi and CCS Procedures:  Antimicrobials:   Subjective: Feels ok, no abd pain since earlier this am  Objective: Filed Vitals:   08/04/15 1013 08/04/15 1044 08/04/15 1122 08/04/15 1137  BP: 88/47 100/56 99/50 104/54  Pulse: 63 82 77 77  Temp:      TempSrc:      Resp: 13 22 22 17   SpO2: 93% 92% 95% 94%   No intake or output data in the 24 hours ending 08/04/15 1253 There were no vitals filed for this visit.  Examination:  General exam: Alert, awake, ill appearing Respiratory system: Clear to auscultation. Respiratory effort normal. Cardiovascular system: S1 & S2 heard, RRR. No JVD, murmurs, rubs, gallops or clicks. No pedal edema. Gastrointestinal system: Abdomen is nondistended, soft and nontender. No organomegaly or masses felt. Normal bowel sounds  Central nervous system: Alert and oriented. No focal neurological deficits. Extremities: Symmetric 5 x 5 power. Skin: No rashes,  lesions or ulcers Psychiatry: Judgement and insight appear normal. Mood & affect appropriate.     Data Reviewed: I have personally reviewed following labs and imaging studies  CBC:  Recent Labs Lab 08/03/15 2035 08/03/15 2352 08/04/15 0619  WBC 8.3 8.0 5.2  NEUTROABS 7.3 6.7  --   HGB 10.4* 10.1* 7.6*  HCT 31.2* 30.6* 23.9*  MCV 89.1 89.5 90.3  PLT 261 223 207   Basic Metabolic Panel:  Recent Labs Lab 08/03/15 2035 08/04/15 0619  NA 139 140  K 3.8 3.7  CL 106 108  CO2 25 25  GLUCOSE 129* 103*  BUN 8 5*  CREATININE 0.61 0.61  CALCIUM 8.9 8.2*   GFR: CrCl cannot be calculated (Unknown ideal weight.). Liver Function Tests:  Recent Labs Lab 08/03/15 2035  AST 23  ALT 20  ALKPHOS 60  BILITOT 0.6  PROT 6.5  ALBUMIN 3.8    Recent Labs Lab 08/03/15 2035  LIPASE 25   No results for input(s): AMMONIA in the last 168 hours. Coagulation Profile:  Recent Labs Lab 08/03/15 2352  INR 1.14   Cardiac Enzymes: No results for input(s): CKTOTAL, CKMB, CKMBINDEX, TROPONINI in the last 168 hours. BNP (last 3 results) No results for input(s): PROBNP in the last 8760 hours. HbA1C: No results for input(s): HGBA1C in the last 72 hours. CBG: No results for input(s): GLUCAP in the last 168 hours. Lipid Profile: No results for input(s): CHOL, HDL, LDLCALC, TRIG, CHOLHDL, LDLDIRECT in the last 72 hours. Thyroid Function Tests: No results for input(s): TSH,  T4TOTAL, FREET4, T3FREE, THYROIDAB in the last 72 hours. Anemia Panel: No results for input(s): VITAMINB12, FOLATE, FERRITIN, TIBC, IRON, RETICCTPCT in the last 72 hours. Urine analysis: No results found for: COLORURINE, APPEARANCEUR, LABSPEC, PHURINE, GLUCOSEU, HGBUR, BILIRUBINUR, KETONESUR, PROTEINUR, UROBILINOGEN, NITRITE, LEUKOCYTESUR Sepsis Labs: @LABRCNTIP (procalcitonin:4,lacticidven:4)  )No results found for this or any previous visit (from the past 240 hour(s)).       Radiology Studies: Ct Abdomen  Pelvis W Contrast  08/03/2015  CLINICAL DATA:  Abdominal pain after colonoscopy today. EXAM: CT ABDOMEN AND PELVIS WITH CONTRAST TECHNIQUE: Multidetector CT imaging of the abdomen and pelvis was performed using the standard protocol following bolus administration of intravenous contrast. CONTRAST:  ISOVUE-300 IOPAMIDOL (ISOVUE-300) INJECTION 61% COMPARISON:  Radiographs 08/03/2015 FINDINGS: There is a large volume peritoneal fluid with high density material collected dependently, characteristic of hemo peritoneum. The source of this presumed blood is not conclusive. There is a suggestion of a contrast blush in the right hemipelvis on coronal image 83 series 4 and on axial image 62 series 2 by a bleeding source is not visible. There is no extraluminal air. The spleen is intact. The liver is intact. Pancreas, adrenals and kidneys are unremarkable. Aorta and IVC are intact. Stomach and small bowel are unremarkable. No focal colonic abnormality is evident. No significant abnormality in the lower chest. No significant musculoskeletal abnormality. IMPRESSION: Large volume hemoperitoneum. No extraluminal air. Source of the presumed bleeding is not evident. These results were called by telephone at the time of interpretation on 08/03/2015 at 11:25 pm to the ER attending physician, who verbally acknowledged these results. Electronically Signed   By: Ellery Plunk M.D.   On: 08/03/2015 23:29   Dg Abd Acute W/chest  08/04/2015  CLINICAL DATA:  Known hemoperitoneum EXAM: DG ABDOMEN ACUTE W/ 1V CHEST COMPARISON:  08/03/2015 FINDINGS: Cardiac shadow is within normal limits. The lungs are clear bilaterally. Contrast material is noted within the colon consistent with a prior CT examination. No free air is noted. No abnormal mass or abnormal calcifications are seen. IMPRESSION: No acute abnormality noted. Electronically Signed   By: Alcide Clever M.D.   On: 08/04/2015 08:33   Dg Abd Acute W/chest  08/03/2015  CLINICAL  DATA:  Colonoscopy earlier today now with generalized abdominal pain. EXAM: DG ABDOMEN ACUTE W/ 1V CHEST COMPARISON:  02/26/2015 FINDINGS: Normal cardiac silhouette and mediastinal contours given patient rotation. There is a punctate (approximately 5 mm) apparent nodule overlying the peripheral aspect of the right upper/mid lung. No focal airspace opacities. No pleural effusion or pneumothorax. No evidence of edema. Nonobstructive bowel gas pattern. No pneumoperitoneum, pneumatosis or portal venous gas. Surgical clip overlies the pelvis, potentially the sequela of prior tubal ligation, unchanged. Multiple phleboliths overlie the lower pelvis bilaterally. Otherwise, no definitive abnormal intra-abdominal calcifications. No acute osseus abnormalities. IMPRESSION: 1. No evidence of complication following recent colonoscopy. Specifically, no evidence of pneumoperitoneum. 2. No acute cardiopulmonary disease. 3. Potential 5 mm nodule overlying the peripheral aspect of the right upper/mid lung, potentially artifactual due to complex of overlying osseous structures. Correlation with prior remote examinations (if available) is recommended. Otherwise follow-up PA and lateral chest radiographs in 4-6 weeks is recommended. Electronically Signed   By: Simonne Come M.D.   On: 08/03/2015 21:01        Scheduled Meds: . sodium chloride   Intravenous STAT  . sodium chloride   Intravenous Once  . sodium chloride   Intravenous Once  . ciprofloxacin  400 mg Intravenous Q12H  .  lip balm  1 application Topical BID  . metronidazole  500 mg Intravenous Q8H  . pantoprazole (PROTONIX) IV  40 mg Intravenous Q12H   Continuous Infusions: . sodium chloride       LOS: 0 days    Time spent:    [name], MD [facility] Pager [phone]  If 7PM-7AM, please contact night-coverage [email or web address] Password [email or web address] 08/04/2015, 12:53 PM

## 2015-08-04 NOTE — ED Notes (Signed)
Leanne, pharmacist, ok to hang Cipro and Flagyl with lactated ringers solution.

## 2015-08-04 NOTE — H&P (Signed)
[facility] Admission History and Physical       Marcia Walker [id number] DOB: 05/26/1955 DOA: 08/03/2015  Referring physician: EDP PCP: No primary care provider on file.  Specialists:   Chief Complaint: ABD Pain Following Colonoscopy  HPI: Marcia DryerHerita R Winger is a 60 y.o. female with a history of GERD who had a Colonoscopy done yesterday 08/03/2015 by Dr Randa EvensEdwards for screening purposes who presents to the ED with complaints of increased diffuse ABD pain which began after the procedure.  She reports that she had been told that the pain was due to used for insufflation during the procedure, and she reports that staff were pressing on her ABD to help move the gas out, but this only increased her pain and discomfort.   She was discharged to home but went to the ED due to continued and worsening of her pain.   She denies any fevers or chills.  She denies passing any melena or hematochezia. She reports having Nausea but no Vomiting.  An Acute ABD Series and a CT scan of the ABD were performed and the CT scan was suggestive of a Large volume Hemoperitoneum extraluminal free air was not seen on either study.    GI on-call was contacted by the EDP, and General Surgery was consulted and patient was referred for medical admission.      Review of Systems:  Constitutional: No Weight Loss, No Weight Gain, Night Sweats, Fevers, Chills, Dizziness, Light Headedness, Fatigue, or Generalized Weakness HEENT: No Headaches, Difficulty Swallowing,Tooth/Dental Problems,Sore Throat,  No Sneezing, Rhinitis, Ear Ache, Nasal Congestion, or Post Nasal Drip,  Cardio-vascular:  No Chest pain, Orthopnea, PND, Edema in Lower Extremities, Anasarca, Dizziness, Palpitations  Resp: No Dyspnea, No DOE, No Productive Cough, No Non-Productive Cough, No Hemoptysis, No Wheezing.    GI: No Heartburn, Indigestion, +Abdominal Pain, +Nausea, Vomiting, Diarrhea, Constipation, Hematemesis, Hematochezia, Melena, Change in Bowel  Habits,  Loss of Appetite  GU: No Dysuria, No Change in Color of Urine, No Urgency or Urinary Frequency, No Flank pain.  Musculoskeletal: No Joint Pain or Swelling, No Decreased Range of Motion, No Back Pain.  Neurologic: No Syncope, No Seizures, Muscle Weakness, Paresthesia, Vision Disturbance or Loss, No Diplopia, No Vertigo, No Difficulty Walking,  Skin: No Rash or Lesions. Psych: No Change in Mood or Affect, No Depression or Anxiety, No Memory loss, No Confusion, or Hallucinations   Past Medical History  Diagnosis Date  . Headache(784.0)     with allergies  . GERD (gastroesophageal reflux disease)      Past Surgical History  Procedure Laterality Date  . Right shoulder manipulation 03/2011  03/2011  . Left shouler manipulation  01/2010  . Other surgical history  15 yrs ago    cyst removed from end of spine  . Uterine fibroid surgery  2007  . I&d extremity  12/17/2011    Procedure: IRRIGATION AND DEBRIDEMENT EXTREMITY;  Surgeon: Marlowe ShoresMatthew A Weingold, MD;  Location: [facility] OR;  Service: Orthopedics;  Laterality: Right;  Right Ring Finger      Prior to Admission medications   Medication Sig Start Date End Date Taking? Authorizing Provider  cetirizine (ZYRTEC) 10 MG tablet Take 10 mg by mouth daily as needed. For allergies   Yes Historical Provider, MD  cycloSPORINE (RESTASIS) 0.05 % ophthalmic emulsion Place 1 drop into both eyes at bedtime as needed (for allergy eye).    Yes Historical Provider, MD  naproxen sodium (ANAPROX) 220 MG tablet Take 220 mg by mouth 2 (two)  times daily as needed (for headache/pain.).   Yes Historical Provider, MD  polyethylene glycol (MIRALAX) packet Take 17 g by mouth daily. Take up to 3 times daily until bowels move. 02/27/15  Yes Shari Upstill, PA-C  albuterol (PROVENTIL HFA;VENTOLIN HFA) 108 (90 Base) MCG/ACT inhaler Inhale 1-2 puffs into the lungs every 6 (six) hours as needed for wheezing or shortness of breath. 05/21/15   [name], MD  EPINEPHrine  0.3 mg/0.3 mL IJ SOAJ injection Inject 0.3 mLs (0.3 mg total) into the muscle once. 05/21/15   [name], MD  pantoprazole (PROTONIX) 20 MG tablet Take 1 tablet (20 mg total) by mouth daily. 02/27/15   [name], PA-C  sodium phosphate (FLEET) enema Place 1 enema rectally daily as needed (for severe constipation). follow package directions    Historical Provider, MD  Spacer/Aero-Holding Chambers (AEROCHAMBER PLUS) inhaler Use as instructed 05/21/15   [name], MD     Allergies  Allergen Reactions  . Prednisone Diarrhea    "moody, paranoid, diarrhea running out of me"  . Amitiza [Lubiprostone]     Chest pain  . Claritin [Loratadine] Other (See Comments)    hallucinations  . Darvocet [Propoxyphene N-Acetaminophen] Other (See Comments)    "felt high as a kite"  . Other Hives    Acidic foods-  . Penicillins Other (See Comments)    Has patient had a PCN reaction causing immediate rash, facial/tongue/throat swelling, SOB or lightheadedness with hypotension:No Has patient had a PCN reaction causing severe rash involving mucus membranes or skin necrosis: No Has patient had a PCN reaction that required hospitalization:Yes Has patient had a PCN reaction occurring within the last 10 years:No If all of the above answers are "NO", then may proceed with Cephalosporin use. Hallucinations.  "makes pt crazy"  . Vibramycin [Doxycycline Monohydrate] Other (See Comments)    Made jaw twist and tongue felt like it was swelling    Social History:  reports that she has never smoked. She has never used smokeless tobacco. She reports that she does not drink alcohol or use illicit drugs.     History reviewed. No pertinent family history.     Physical Exam:  GEN:  Pleasant Well Nourished and Well Developed 60 y.o. female examined and in no acute distress; cooperative with exam Filed Vitals:   08/03/15 1950 08/03/15 2216 08/04/15 0011  BP: 136/85 128/60 121/70  Pulse: 103 87 80    Temp: 98.1 F (36.7 C)    TempSrc: Oral    Resp: SpO2: 100% 99% 99%   Blood pressure 121/70, pulse 80, temperature 98.1 F (36.7 C), temperature source Oral, resp. rate 18, SpO2 99 %. PSYCH: She is alert and oriented x4; does not appear anxious does not appear depressed; affect is normal HEENT: Normocephalic and Atraumatic, Mucous membranes pink; PERRLA; EOM intact; Fundi:  Benign;  No scleral icterus, Nares: Patent, Oropharynx: Clear, Fair Dentition,    Neck:  FROM, No Cervical Lymphadenopathy nor Thyromegaly or Carotid Bruit; No JVD; Breasts:: Not examined CHEST WALL: No tenderness CHEST: Normal respiration, clear to auscultation bilaterally HEART: Regular rate and rhythm; no murmurs rubs or gallops BACK: No kyphosis or scoliosis; No CVA tenderness ABDOMEN: Positive Bowel Sounds, Soft Mildly Tender on Palpation of Epigastrium, and RUQ, and RLQ, No Rebound or Guarding; No Masses, No Organomegaly. Rectal Exam: Not done EXTREMITIES: No Cyanosis, Clubbing, or Edema; No Ulcerations. Genitalia: not examined PULSES: 2+ and symmetric SKIN: Normal hydration no rash or ulceration  CNS:  Alert and Oriented x 4, No Focal Deficits Vascular: pulses palpable throughout    Labs on Admission:  Basic Metabolic Panel:  Recent Labs Lab 08/03/15 2035  NA 139  K 3.8  CL 106  CO2 25  GLUCOSE 129*  BUN 8  CREATININE 0.61  CALCIUM 8.9   Liver Function Tests:  Recent Labs Lab 08/03/15 2035  AST 23  ALT 20  ALKPHOS 60  BILITOT 0.6  PROT 6.5  ALBUMIN 3.8    Recent Labs Lab 08/03/15 2035  LIPASE 25   No results for input(s): AMMONIA in the last 168 hours. CBC:  Recent Labs Lab 08/03/15 2035 08/03/15 2352  WBC 8.3 8.0  NEUTROABS 7.3 6.7  HGB 10.4* 10.1*  HCT 31.2* 30.6*  MCV 89.1 89.5  PLT 261 223   Cardiac Enzymes: No results for input(s): CKTOTAL, CKMB, CKMBINDEX, TROPONINI in the last 168 hours.  BNP (last 3 results) No results for input(s): BNP in the  last 8760 hours.  ProBNP (last 3 results) No results for input(s): PROBNP in the last 8760 hours.  CBG: No results for input(s): GLUCAP in the last 168 hours.  Radiological Exams on Admission: Ct Abdomen Pelvis W Contrast  08/03/2015  CLINICAL DATA:  Abdominal pain after colonoscopy today. EXAM: CT ABDOMEN AND PELVIS WITH CONTRAST TECHNIQUE: Multidetector CT imaging of the abdomen and pelvis was performed using the standard protocol following bolus administration of intravenous contrast. CONTRAST:  ISOVUE-300 IOPAMIDOL (ISOVUE-300) INJECTION 61% COMPARISON:  Radiographs 08/03/2015 FINDINGS: There is a large volume peritoneal fluid with high density material collected dependently, characteristic of hemo peritoneum. The source of this presumed blood is not conclusive. There is a suggestion of a contrast blush in the right hemipelvis on coronal image 83 series 4 and on axial image 62 series 2 by a bleeding source is not visible. There is no extraluminal air. The spleen is intact. The liver is intact. Pancreas, adrenals and kidneys are unremarkable. Aorta and IVC are intact. Stomach and small bowel are unremarkable. No focal colonic abnormality is evident. No significant abnormality in the lower chest. No significant musculoskeletal abnormality. IMPRESSION: Large volume hemoperitoneum. No extraluminal air. Source of the presumed bleeding is not evident. These results were called by telephone at the time of interpretation on 08/03/2015 at 11:25 pm to the ER attending physician, who verbally acknowledged these results. Electronically Signed   By: Ellery Plunk M.D.   On: 08/03/2015 23:29   Dg Abd Acute W/chest  08/03/2015  CLINICAL DATA:  Colonoscopy earlier today now with generalized abdominal pain. EXAM: DG ABDOMEN ACUTE W/ 1V CHEST COMPARISON:  02/26/2015 FINDINGS: Normal cardiac silhouette and mediastinal contours given patient rotation. There is a punctate (approximately 5 mm) apparent nodule  overlying the peripheral aspect of the right upper/mid lung. No focal airspace opacities. No pleural effusion or pneumothorax. No evidence of edema. Nonobstructive bowel gas pattern. No pneumoperitoneum, pneumatosis or portal venous gas. Surgical clip overlies the pelvis, potentially the sequela of prior tubal ligation, unchanged. Multiple phleboliths overlie the lower pelvis bilaterally. Otherwise, no definitive abnormal intra-abdominal calcifications. No acute osseus abnormalities. IMPRESSION: 1. No evidence of complication following recent colonoscopy. Specifically, no evidence of pneumoperitoneum. 2. No acute cardiopulmonary disease. 3. Potential 5 mm nodule overlying the peripheral aspect of the right upper/mid lung, potentially artifactual due to complex of overlying osseous structures. Correlation with prior remote examinations (if available) is recommended. Otherwise follow-up PA and lateral chest radiographs in 4-6 weeks is recommended. Electronically Signed   By:  Simonne Come M.D.   On: 08/03/2015 21:01     EKG: Independently reviewed.         Assessment/Plan:      60 y.o. female with  Principal Problem:    Hemoperitoneum- on CT scan, but no Free Air seen   Admit to SDU , and monitor   NPO except for [employer] [facility] Following   IV Cipro and MNTZ   IVFs   Pain Control with IV Dilaudid PRN   Monitor H/Hs   IV Protonix   Active Problems:    Abdominal pain in female- Pain reported as Post Procedure   Monitor   Pain Control    Follow- Up Imaging in AM    To assess for Free Air      S/P colonoscopy   GI to see in AM      GERD (gastroesophageal reflux disease)   IV Protonix    DVT Prophylaxis   SCDs    Code Status:     FULL CODE      Family Communication:   Mother and Sister at Bedside  Disposition Plan:    Inpatient Status        Time spent: 45 Minutes      Ron Parker [facility] Pager [phone]   If 7AM -7PM Please Contact the Day  Rounding Team MD for [facility]  If 7PM-7AM, Please Contact Night-Floor Coverage  [email or web address] Password [email or web address] 08/04/2015, 12:43 AM     ADDENDUM:   Patient was seen and examined on 08/04/2015

## 2015-08-04 NOTE — Consult Note (Signed)
[facility]  [address] [place], [place] [address] Phone: [phone] FAX: [phone]     LAKEYN DOKKEN  11/20/1955 [id number]  CARE TEAM:  PCP: No primary care provider on file.  Outpatient Care Team: Patient Care Team: Charlotte Crumb, MD as Consulting Physician (Orthopedic Surgery) Laurence Spates, MD as Consulting Physician (Gastroenterology)  Inpatient Treatment Team: Treatment Team: Attending Provider: Theressa Millard, MD; Registered Nurse: Loma Sender Drewery, RN; Technician: Pollyann Glen, NT; Consulting Physician: Laurence Spates, MD; Consulting Physician: Nolon Nations, MD  This patient is a 60 y.o.female who presents today for surgical evaluation at the request of Dr Darl Householder.   Reason for evaluation: Abdominal pain after colonoscopy.  Concern for fluid and hemoperitoneum.  Pleasant woman.  Underwent her first screening colonoscopy today.  According to verbal reports, was simple colonoscopy without any abnormalities.  No biopsies done.  However the patient was uncomfortable after procedure.  Initially the impression was most likely was endoscopic gas and would improve.  Was sent home with the office closed.  Patient especially mother concerned that abdominal pain ever anyone away.  Came to the emergency room.  Vital signs showed increased heart rate.  Proceed pain medication and felt better.  Vital signs normalized.  No free air.  Because of persistent discomfort, CT scan done.  Fluid noted in the pelvis suspicious for hemoperitoneum.  Surgical consultation requested.    The patient's family is at the bedside.  She is a little uncomfortable but chatty.  No personal nor family history of GI/colon cancer, inflammatory bowel disease, irritable bowel syndrome, allergy such as Celiac Sprue, dietary/dairy problems, colitis, ulcers nor gastritis.  No recent sick contacts/gastroenteritis.  No travel outside the country.  No changes in diet.   No dysphagia to solids or liquids.  No significant heartburn or reflux.  No hematochezia, hematemesis, coffee ground emesis.  No evidence of prior gastric/peptic ulceration.  She had surgery for uterine fibroids.  No vaginal bleeding or discharge.  She does not take blood thinners.  She may have taken some naproxen in the past week or so but not recently.  I do not think there is any issues with a bowel prep.  Records not available concerning that colonoscopy.  Family does not have a copy of the port.    Past Medical History  Diagnosis Date  . Headache(784.0)     with allergies  . GERD (gastroesophageal reflux disease)     Past Surgical History  Procedure Laterality Date  . Right shoulder manipulation 03/2011  03/2011  . Left shouler manipulation  01/2010  . Other surgical history  15 yrs ago    cyst removed from end of spine  . Uterine fibroid surgery  2007  . I&d extremity  12/17/2011    Procedure: IRRIGATION AND DEBRIDEMENT EXTREMITY;  Surgeon: Schuyler Amor, MD;  Location: [facility];  Service: Orthopedics;  Laterality: Right;  Right Ring Finger    Social History   Social History  . Marital Status: Single    Spouse Name: N/A  . Number of Children: N/A  . Years of Education: N/A   Occupational History  . Not on file.   Social History Main Topics  . Smoking status: Never Smoker   . Smokeless tobacco: Never Used  . Alcohol Use: No  . Drug Use: No  . Sexual Activity: Not on file   Other Topics Concern  . Not on file   Social History Narrative  History reviewed. No pertinent family history.  Current Facility-Administered Medications  Medication Dose Route Frequency Provider Last Rate Last Dose  . lactated ringers bolus 1,000 mL  1,000 mL Intravenous Once Michael Boston, MD   1,000 mL at 08/04/15 0010  . lactated ringers bolus 1,000 mL  1,000 mL Intravenous Q8H PRN Michael Boston, MD      . pantoprazole (PROTONIX) injection 40 mg  40 mg Intravenous Q12H Theressa Millard, MD       Current Outpatient Prescriptions  Medication Sig Dispense Refill  . cetirizine (ZYRTEC) 10 MG tablet Take 10 mg by mouth daily as needed. For allergies    . cycloSPORINE (RESTASIS) 0.05 % ophthalmic emulsion Place 1 drop into both eyes at bedtime as needed (for allergy eye).     . naproxen sodium (ANAPROX) 220 MG tablet Take 220 mg by mouth 2 (two) times daily as needed (for headache/pain.).    Marland Kitchen polyethylene glycol (MIRALAX) packet Take 17 g by mouth daily. Take up to 3 times daily until bowels move. 3 each 0  . albuterol (PROVENTIL HFA;VENTOLIN HFA) 108 (90 Base) MCG/ACT inhaler Inhale 1-2 puffs into the lungs every 6 (six) hours as needed for wheezing or shortness of breath. 1 Inhaler 0  . EPINEPHrine 0.3 mg/0.3 mL IJ SOAJ injection Inject 0.3 mLs (0.3 mg total) into the muscle once. 1 Device 1  . pantoprazole (PROTONIX) 20 MG tablet Take 1 tablet (20 mg total) by mouth daily. 20 tablet 0  . sodium phosphate (FLEET) enema Place 1 enema rectally daily as needed (for severe constipation). follow package directions    . Spacer/Aero-Holding Chambers (AEROCHAMBER PLUS) inhaler Use as instructed 1 each 2     Allergies  Allergen Reactions  . Prednisone Diarrhea    "moody, paranoid, diarrhea running out of me"  . Amitiza [Lubiprostone]     Chest pain  . Claritin [Loratadine] Other (See Comments)    hallucinations  . Darvocet [Propoxyphene N-Acetaminophen] Other (See Comments)    "felt high as a kite"  . Other Hives    Acidic foods-  . Penicillins Other (See Comments)    Has patient had a PCN reaction causing immediate rash, facial/tongue/throat swelling, SOB or lightheadedness with hypotension:No Has patient had a PCN reaction causing severe rash involving mucus membranes or skin necrosis: No Has patient had a PCN reaction that required hospitalization:Yes Has patient had a PCN reaction occurring within the last 10 years:No If all of the above answers are "NO", then may  proceed with Cephalosporin use. Hallucinations.  "makes pt crazy"  . Vibramycin [Doxycycline Monohydrate] Other (See Comments)    Made jaw twist and tongue felt like it was swelling    ROS: Constitutional:  No fevers, chills, sweats.  Weight stable Eyes:  No vision changes, No discharge HENT:  No sore throats, nasal drainage Lymph: No neck swelling, No bruising easily Pulmonary:  No cough, productive sputum CV: No orthopnea, PND  Patient walks 20 minutes without difficulty.  No exertional chest/neck/shoulder/arm pain. GI:  No personal nor family history of GI/colon cancer, inflammatory bowel disease, irritable bowel syndrome, allergy such as Celiac Sprue, dietary/dairy problems, colitis, ulcers nor gastritis.  No recent sick contacts/gastroenteritis.  No travel outside the country.  No changes in diet. Renal: No UTIs, No hematuria Genital:  No drainage, bleeding, masses Musculoskeletal: No severe joint pain.  Good ROM major joints Skin:  No sores or lesions.  No rashes Heme/Lymph:  No easy bleeding.  No swollen lymph nodes  Neuro: No focal weakness/numbness.  No seizures Psych: No suicidal ideation.  No hallucinations  BP 121/70 mmHg  Pulse 80  Temp(Src) 98.1 F (36.7 C) (Oral)  Resp 18  SpO2 99%  Physical Exam: General: Pt awake/alert/oriented x4 in mild distress.  She looks tired but not toxic   Eyes: PERRL, normal EOM. Sclera nonicteric Neuro: CN II-XII intact w/o focal sensory/motor deficits. Lymph: No head/neck/groin lymphadenopathy Psych:  No delerium/psychosis/paranoia.  Not anxious.  Pleasant.  Curious.  Interactive  HENT: Normocephalic, Mucus membranes moist.  No thrush Neck: Supple, No tracheal deviation Chest: No pain.  Good respiratory excursion. CV:  Pulses intact.  Regular rhythm Abdomen: Flat.  Mostly soft.  Somewhat firm.  Nondistended.  Mild discomfort in right side of abdomen.  No rebound tenderness or guarding.  No hesitancy with cough.  Not worse with bed  shake.   Genital no inguinal hernias.  No vaginal bleeding/discharge. Rectal deferred given colonoscopy today and no hematochezia. Ext:  SCDs BLE.  No significant edema.  No cyanosis Skin: No petechiae / purpurea.  No major sores Musculoskeletal: No severe joint pain.  Good ROM major joints   Results:   Labs: Results for orders placed or performed during the hospital encounter of 08/03/15 (from the past 48 hour(s))  CBC with Differential     Status: Abnormal   Collection Time: 08/03/15  8:35 PM  Result Value Ref Range   WBC 8.3 4.0 - 10.5 K/uL   RBC 3.50 (L) 3.87 - 5.11 MIL/uL   Hemoglobin 10.4 (L) 12.0 - 15.0 g/dL   HCT 31.2 (L) 36.0 - 46.0 %   MCV 89.1 78.0 - 100.0 fL   MCH 29.7 26.0 - 34.0 pg   MCHC 33.3 30.0 - 36.0 g/dL   RDW 13.7 11.5 - 15.5 %   Platelets 261 150 - 400 K/uL   Neutrophils Relative % 88 %   Neutro Abs 7.3 1.7 - 7.7 K/uL   Lymphocytes Relative 7 %   Lymphs Abs 0.6 (L) 0.7 - 4.0 K/uL   Monocytes Relative 5 %   Monocytes Absolute 0.4 0.1 - 1.0 K/uL   Eosinophils Relative 0 %   Eosinophils Absolute 0.0 0.0 - 0.7 K/uL   Basophils Relative 0 %   Basophils Absolute 0.0 0.0 - 0.1 K/uL  Comprehensive metabolic panel     Status: Abnormal   Collection Time: 08/03/15  8:35 PM  Result Value Ref Range   Sodium 139 135 - 145 mmol/L   Potassium 3.8 3.5 - 5.1 mmol/L   Chloride 106 101 - 111 mmol/L   CO2 25 22 - 32 mmol/L   Glucose, Bld 129 (H) 65 - 99 mg/dL   BUN 8 6 - 20 mg/dL   Creatinine, Ser 0.61 0.44 - 1.00 mg/dL   Calcium 8.9 8.9 - 10.3 mg/dL   Total Protein 6.5 6.5 - 8.1 g/dL   Albumin 3.8 3.5 - 5.0 g/dL   AST 23 15 - 41 U/L   ALT 20 14 - 54 U/L   Alkaline Phosphatase 60 38 - 126 U/L   Total Bilirubin 0.6 0.3 - 1.2 mg/dL   GFR calc non Af Amer >60 >60 mL/min   GFR calc Af Amer >60 >60 mL/min    Comment: (NOTE) The eGFR has been calculated using the CKD EPI equation. This calculation has not been validated in all clinical situations. eGFR's  persistently <60 mL/min signify possible Chronic Kidney Disease.    Anion gap 8 5 - 15  Lipase,  blood     Status: None   Collection Time: 08/03/15  8:35 PM  Result Value Ref Range   Lipase 25 11 - 51 U/L  Type and screen     Status: None (Preliminary result)   Collection Time: 08/03/15 11:52 PM  Result Value Ref Range   ABO/RH(D) A POS    Antibody Screen PENDING    Sample Expiration 08/06/2015   CBC with Differential     Status: Abnormal   Collection Time: 08/03/15 11:52 PM  Result Value Ref Range   WBC 8.0 4.0 - 10.5 K/uL   RBC 3.42 (L) 3.87 - 5.11 MIL/uL   Hemoglobin 10.1 (L) 12.0 - 15.0 g/dL   HCT 30.6 (L) 36.0 - 46.0 %   MCV 89.5 78.0 - 100.0 fL   MCH 29.5 26.0 - 34.0 pg   MCHC 33.0 30.0 - 36.0 g/dL   RDW 13.8 11.5 - 15.5 %   Platelets 223 150 - 400 K/uL   Neutrophils Relative % 84 %   Neutro Abs 6.7 1.7 - 7.7 K/uL   Lymphocytes Relative 10 %   Lymphs Abs 0.8 0.7 - 4.0 K/uL   Monocytes Relative 6 %   Monocytes Absolute 0.5 0.1 - 1.0 K/uL   Eosinophils Relative 0 %   Eosinophils Absolute 0.0 0.0 - 0.7 K/uL   Basophils Relative 0 %   Basophils Absolute 0.0 0.0 - 0.1 K/uL  Protime-INR     Status: None   Collection Time: 08/03/15 11:52 PM  Result Value Ref Range   Prothrombin Time 14.3 11.6 - 15.2 seconds   INR 1.14 0.00 - 1.49    Imaging / Studies: Ct Abdomen Pelvis W Contrast  08/03/2015  CLINICAL DATA:  Abdominal pain after colonoscopy today. EXAM: CT ABDOMEN AND PELVIS WITH CONTRAST TECHNIQUE: Multidetector CT imaging of the abdomen and pelvis was performed using the standard protocol following bolus administration of intravenous contrast. CONTRAST:  14m ISOVUE-300 IOPAMIDOL (ISOVUE-300) INJECTION 61% COMPARISON:  Radiographs 08/03/2015 FINDINGS: There is a large volume peritoneal fluid with high density material collected dependently, characteristic of hemo peritoneum. The source of this presumed blood is not conclusive. There is a suggestion of a contrast blush in  the right hemipelvis on coronal image 83 series 4 and on axial image 62 series 2 by a bleeding source is not visible. There is no extraluminal air. The spleen is intact. The liver is intact. Pancreas, adrenals and kidneys are unremarkable. Aorta and IVC are intact. Stomach and small bowel are unremarkable. No focal colonic abnormality is evident. No significant abnormality in the lower chest. No significant musculoskeletal abnormality. IMPRESSION: Large volume hemoperitoneum. No extraluminal air. Source of the presumed bleeding is not evident. These results were called by telephone at the time of interpretation on 08/03/2015 at 11:25 pm to the ER attending physician, who verbally acknowledged these results. Electronically Signed   By: DAndreas NewportM.D.   On: 08/03/2015 23:29   Dg Abd Acute W/chest  08/03/2015  CLINICAL DATA:  Colonoscopy earlier today now with generalized abdominal pain. EXAM: DG ABDOMEN ACUTE W/ 1V CHEST COMPARISON:  02/26/2015 FINDINGS: Normal cardiac silhouette and mediastinal contours given patient rotation. There is a punctate (approximately 5 mm) apparent nodule overlying the peripheral aspect of the right upper/mid lung. No focal airspace opacities. No pleural effusion or pneumothorax. No evidence of edema. Nonobstructive bowel gas pattern. No pneumoperitoneum, pneumatosis or portal venous gas. Surgical clip overlies the pelvis, potentially the sequela of prior tubal ligation, unchanged. Multiple phleboliths  overlie the lower pelvis bilaterally. Otherwise, no definitive abnormal intra-abdominal calcifications. No acute osseus abnormalities. IMPRESSION: 1. No evidence of complication following recent colonoscopy. Specifically, no evidence of pneumoperitoneum. 2. No acute cardiopulmonary disease. 3. Potential 5 mm nodule overlying the peripheral aspect of the right upper/mid lung, potentially artifactual due to complex of overlying osseous structures. Correlation with prior remote  examinations (if available) is recommended. Otherwise follow-up PA and lateral chest radiographs in 4-6 weeks is recommended. Electronically Signed   By: Sandi Mariscal M.D.   On: 08/03/2015 21:01    Medications / Allergies: per chart  Antibiotics: Anti-infectives    None      Assessment  Sharmon R Ballew  60 y.o. female       Problem List:  Principal Problem:   Hemoperitoneum Active Problems:   GERD (gastroesophageal reflux disease)   Abdominal pain in female   S/P colonoscopy   Abdominal pain with free fluid suspicious for hemoperitoneum status post routine screening colonoscopy that was uneventful without any biopsies.  Plan:  The patient looks uncomfortable but is not frankly toxic.  She already feels better after one narcotic dose and some IV fluids.  She is not hemostatically stable or in shock.  There is no evidence of any pneumoperitoneum.  She does have some abdominal discomfort she does not have a surgical abdomen with classic peritonitis.  I do not think this is a surgical emergency at this time.  Should the fluid just be blood, usually we manage this nonoperatively unless the patient develops peritonitis or shock.  She perhaps could have had a rupture into her rectosigmoid mesentery which causes some bleeding in the mesentery.  However that seems unlikely in a routine colonoscopy in the absence of any free air or shock.  Discussed with Dr. Darl Householder with [facility].  Discussed with Dr. Pablo Ledger with [facility].  However, she does seem uncomfortable.   Reasonable for her to be admitted and observed.  Gastroenterology declines admission.  Therefore most likely medicine admission with surgical consultation.    Place on IV fluids.  Nothing by mouth.  IV antibiotics.  Serial exams  Should she not improve or if she declines, may benefit from abdominal exploration to rule out perforation, ischemia, or other etiology.  She does have a history  of uterine fibroids and endometrial ablation in the past but has no strong evidence of uterine bleeding.  She is not fully anticoagulated.  Rarely takes nonsteroidals.  Reasonable to rule out any coagulopathy.  She is feeling better, so hopefully this can stabilize nonoperatively.  We will continue to follow  -VTE prophylaxis- SCDs, etc  -mobilize as tolerated to help recovery    Adin Hector, M.D., F.A.C.S. Gastrointestinal and Minimally Invasive Surgery [facility] [address], [place], [place] [address] [phone] Main / Paging   08/04/2015  Note: Portions of this report may have been transcribed using voice recognition software. Every effort was made to ensure accuracy; however, inadvertent computerized transcription errors may be present.   Any transcriptional errors that result from this process are unintentional.

## 2015-08-04 NOTE — ED Notes (Signed)
Pt's O2 sats 74% on Room Air while sleeping; pt placed on O2 @ 2lpm via [place]

## 2015-08-04 NOTE — Progress Notes (Signed)
Subjective: Pt currently more comfortable, after pain med.  She did walk to the BR and denied dizziness at that time. Pain on admit was in the upper abdomen below the xyphoid, and lower mid abdomen below the umbilicus.   Objective: Vital signs in last 24 hours: Temp:  [98.1 F (36.7 C)] 98.1 F (36.7 C) (05/03 1950) Pulse Rate:  [61-103] 74 (05/04 0745) Resp:  [13-18] 16 (05/04 0745) BP: (82-136)/(48-85) 100/57 mmHg (05/04 0745) SpO2:  [92 %-100 %] 93 % (05/04 0745)    Nurse reports she has had about 4 liters in the ED Urine not recorded She is afebrile, BP dow to a low of 82/48, now up to 100/57, HR 69, she is quite stable, orthostatic BP is fine, minimal change. Labs show she has dropped her H/H from 10.4/31.2 to 7.6/23.9 since admit to the ED last PM. BMP OK Intake/Output from previous day:   Intake/Output this shift:    General appearance: alert, cooperative and no distress GI: soft, not really tender, few BS, no distension, sore lower abdomen now.  No peritonitis.  Lab Results:   Recent Labs  08/03/15 2352 08/04/15 0619  WBC 8.0 5.2  HGB 10.1* 7.6*  HCT 30.6* 23.9*  PLT 223 207    BMET  Recent Labs  08/03/15 2035 08/04/15 0619  NA 139 140  K 3.8 3.7  CL 106 108  CO2 25 25  GLUCOSE 129* 103*  BUN 8 5*  CREATININE 0.61 0.61  CALCIUM 8.9 8.2*   PT/INR  Recent Labs  08/03/15 2352  LABPROT 14.3  INR 1.14     Recent Labs Lab 08/03/15 2035  AST 23  ALT 20  ALKPHOS 60  BILITOT 0.6  PROT 6.5  ALBUMIN 3.8     Lipase     Component Value Date/Time   LIPASE 25 08/03/2015 2035     Studies/Results: Ct Abdomen Pelvis W Contrast  08/03/2015  CLINICAL DATA:  Abdominal pain after colonoscopy today. EXAM: CT ABDOMEN AND PELVIS WITH CONTRAST TECHNIQUE: Multidetector CT imaging of the abdomen and pelvis was performed using the standard protocol following bolus administration of intravenous contrast. CONTRAST:  ISOVUE-300 IOPAMIDOL (ISOVUE-300)  INJECTION 61% COMPARISON:  Radiographs 08/03/2015 FINDINGS: There is a large volume peritoneal fluid with high density material collected dependently, characteristic of hemo peritoneum. The source of this presumed blood is not conclusive. There is a suggestion of a contrast blush in the right hemipelvis on coronal image 83 series 4 and on axial image 62 series 2 by a bleeding source is not visible. There is no extraluminal air. The spleen is intact. The liver is intact. Pancreas, adrenals and kidneys are unremarkable. Aorta and IVC are intact. Stomach and small bowel are unremarkable. No focal colonic abnormality is evident. No significant abnormality in the lower chest. No significant musculoskeletal abnormality. IMPRESSION: Large volume hemoperitoneum. No extraluminal air. Source of the presumed bleeding is not evident. These results were called by telephone at the time of interpretation on 08/03/2015 at 11:25 pm to the ER attending physician, who verbally acknowledged these results. Electronically Signed   By: Ellery Plunk M.D.   On: 08/03/2015 23:29   Dg Abd Acute W/chest  08/03/2015  CLINICAL DATA:  Colonoscopy earlier today now with generalized abdominal pain. EXAM: DG ABDOMEN ACUTE W/ 1V CHEST COMPARISON:  02/26/2015 FINDINGS: Normal cardiac silhouette and mediastinal contours given patient rotation. There is a punctate (approximately 5 mm) apparent nodule overlying the peripheral aspect of the right upper/mid lung. No  focal airspace opacities. No pleural effusion or pneumothorax. No evidence of edema. Nonobstructive bowel gas pattern. No pneumoperitoneum, pneumatosis or portal venous gas. Surgical clip overlies the pelvis, potentially the sequela of prior tubal ligation, unchanged. Multiple phleboliths overlie the lower pelvis bilaterally. Otherwise, no definitive abnormal intra-abdominal calcifications. No acute osseus abnormalities. IMPRESSION: 1. No evidence of complication following recent  colonoscopy. Specifically, no evidence of pneumoperitoneum. 2. No acute cardiopulmonary disease. 3. Potential 5 mm nodule overlying the peripheral aspect of the right upper/mid lung, potentially artifactual due to complex of overlying osseous structures. Correlation with prior remote examinations (if available) is recommended. Otherwise follow-up PA and lateral chest radiographs in 4-6 weeks is recommended. Electronically Signed   By: Simonne ComeJohn  Watts M.D.   On: 08/03/2015 21:01   Prior to Admission medications   Medication Sig Start Date End Date Taking? Authorizing Provider  cetirizine (ZYRTEC) 10 MG tablet Take 10 mg by mouth daily as needed. For allergies   Yes Historical Provider, MD  cycloSPORINE (RESTASIS) 0.05 % ophthalmic emulsion Place 1 drop into both eyes at bedtime as needed (for allergy eye).    Yes Historical Provider, MD  naproxen sodium (ANAPROX) 220 MG tablet Take 220 mg by mouth 2 (two) times daily as needed (for headache/pain.).   Yes Historical Provider, MD  polyethylene glycol (MIRALAX) packet Take 17 g by mouth daily. Take up to 3 times daily until bowels move. 02/27/15  Yes Shari Upstill, PA-C  albuterol (PROVENTIL HFA;VENTOLIN HFA) 108 (90 Base) MCG/ACT inhaler Inhale 1-2 puffs into the lungs every 6 (six) hours as needed for wheezing or shortness of breath. 05/21/15   Domenick GongAshley Mortenson, MD  EPINEPHrine 0.3 mg/0.3 mL IJ SOAJ injection Inject 0.3 mLs (0.3 mg total) into the muscle once. 05/21/15   Domenick GongAshley Mortenson, MD  pantoprazole (PROTONIX) 20 MG tablet Take 1 tablet (20 mg total) by mouth daily. 02/27/15   Elpidio AnisShari Upstill, PA-C  sodium phosphate (FLEET) enema Place 1 enema rectally daily as needed (for severe constipation). follow package directions    Historical Provider, MD  Spacer/Aero-Holding Chambers (AEROCHAMBER PLUS) inhaler Use as instructed 05/21/15   Domenick GongAshley Mortenson, MD    Medications: . lip balm  1 application Topical BID  . pantoprazole (PROTONIX) IV  40 mg Intravenous  Q12H    . sodium chloride 150 mL/hr at 08/04/15 0727  . sodium chloride    . sodium chloride    . ciprofloxacin Stopped (08/04/15 0454)  . lactated ringers    . lactated ringers    . methocarbamol (ROBAXIN)  IV    . metronidazole 500 mg (08/04/15 0740)    Assessment/Plan Post colonoscopy with hemoperitoneum GERD Hx of constipation FEN:  IV fluids ID:  Flagyl/Cipro day 1 VTE:  SCD/Bleeding no anticoagulants    Plan:  Dr. Derrell LollingIngram review CT with Dr. Archer AsaMcCullough Interventional radiology.  No acute bleeding or sentinel bleeding source noted.  It was Dr. Henri MedalMcCullough's recommendation we observer her for now and if she has further decline, we call and set her up for further evaluation by IR.  She is going to the ICU, she has a CBC ordered for noon.  If her Hemoglobin drops below 7 we would recommend transfusion.  We have ordered type and cross for 2 units this AM.    LOS: 0 days    Srinidhi Landers 08/04/2015 [phone]

## 2015-08-04 NOTE — ED Notes (Signed)
MD is aware of patient's hemoglobin.

## 2015-08-04 NOTE — ED Notes (Signed)
Blood consent form at bedside

## 2015-08-04 NOTE — Progress Notes (Signed)
Informed Dr. Jomarie LongsJoseph about pt 99.9 temp.  MD said to continue to monitor and inform Dr. Jomarie LongsJoseph if greater than 103.  Erick Blinksuchman, Geri Hepler D, RN

## 2015-08-04 NOTE — ED Notes (Signed)
Per MD, have RBC prepared but do not infuse.

## 2015-08-05 LAB — BASIC METABOLIC PANEL
ANION GAP: 6 (ref 5–15)
BUN: <5 mg/dL — ABNORMAL LOW (ref 6–20)
CHLORIDE: 110 mmol/L (ref 101–111)
CO2: 26 mmol/L (ref 22–32)
Calcium: 8 mg/dL — ABNORMAL LOW (ref 8.9–10.3)
Creatinine, Ser: 0.75 mg/dL (ref 0.44–1.00)
Glucose, Bld: 108 mg/dL — ABNORMAL HIGH (ref 65–99)
POTASSIUM: 3.5 mmol/L (ref 3.5–5.1)
SODIUM: 142 mmol/L (ref 135–145)

## 2015-08-05 LAB — CBC
HEMATOCRIT: 25 % — AB (ref 36.0–46.0)
HEMOGLOBIN: 8.2 g/dL — AB (ref 12.0–15.0)
MCH: 28.7 pg (ref 26.0–34.0)
MCHC: 32.8 g/dL (ref 30.0–36.0)
MCV: 87.4 fL (ref 78.0–100.0)
Platelets: 215 10*3/uL (ref 150–400)
RBC: 2.86 MIL/uL — ABNORMAL LOW (ref 3.87–5.11)
RDW: 13.6 % (ref 11.5–15.5)
WBC: 5.4 10*3/uL (ref 4.0–10.5)

## 2015-08-05 MED ORDER — SIMETHICONE 80 MG PO CHEW
160.0000 mg | CHEWABLE_TABLET | Freq: Once | ORAL | Status: AC
  Administered 2015-08-05: 160 mg via ORAL
  Filled 2015-08-05: qty 2

## 2015-08-05 MED ORDER — SALINE SPRAY 0.65 % NA SOLN
1.0000 | NASAL | Status: DC | PRN
  Administered 2015-08-05: 1 via NASAL
  Filled 2015-08-05: qty 44

## 2015-08-05 MED ORDER — POLYETHYLENE GLYCOL 3350 17 G PO PACK
17.0000 g | PACK | Freq: Two times a day (BID) | ORAL | Status: DC
Start: 2015-08-05 — End: 2015-08-06
  Administered 2015-08-05: 17 g via ORAL
  Filled 2015-08-05 (×3): qty 1

## 2015-08-05 MED ORDER — SIMETHICONE 80 MG PO CHEW
80.0000 mg | CHEWABLE_TABLET | Freq: Four times a day (QID) | ORAL | Status: DC | PRN
  Administered 2015-08-05 – 2015-08-07 (×4): 80 mg via ORAL
  Filled 2015-08-05 (×4): qty 1

## 2015-08-05 NOTE — Progress Notes (Signed)
[facility] PROGRESS NOTE Subjective patient feels much better and is tolerating solid food with passage of flatus and small bowel movement. Main concern is fever. She is on antibiotics. She states she has not required in the IV pain medicine since yesterday morning.  Objective: Vital signs in last 24 hours: Temp:  [99.3 F (37.4 C)-102 F (38.9 C)] 99.3 F (37.4 C) (05/05 0700) Pulse Rate:  [61-106] 74 (05/05 0600) Resp:  [0-25] 19 (05/05 0600) BP: (88-119)/(36-57) 112/43 mmHg (05/05 0200) SpO2:  [87 %-99 %] 93 % (05/05 0600)    Intake/Output from previous day: 05/04 0701 - 05/05 0700 In: 2905 [P.O.:360; I.V.:1645; IV Piggyback:900] Out: 780 [Urine:780] Intake/Output this shift:    PE: General-- no acute distress  Abdomen-- hyperactive bowel sounds, nondistended much less tender  Lab Results:  Recent Labs  08/03/15 2035 08/03/15 2352 08/04/15 0619 08/04/15 1240 08/04/15 1750  WBC 8.3 8.0 5.2 5.0 5.2  HGB 10.4* 10.1* 7.6* 8.8* 8.3*  HCT 31.2* 30.6* 23.9* 26.7* 25.1*  PLT 261 223 207 206 200   BMET  Recent Labs  08/03/15 2035 08/04/15 0619 08/05/15 0314  NA 139 140 142  K 3.8 3.7 3.5  CL 106 108 110  CO2 CREATININE 0.61 0.61 0.75   LFT  Recent Labs  08/03/15 2035  PROT 6.5  AST 23  ALT 20  ALKPHOS 60  BILITOT 0.6   PT/INR  Recent Labs  08/03/15 2352  LABPROT 14.3  INR 1.14   PANCREAS  Recent Labs  08/03/15 2035  LIPASE 25         Studies/Results: Ct Abdomen Pelvis W Contrast  08/03/2015  CLINICAL DATA:  Abdominal pain after colonoscopy today. EXAM: CT ABDOMEN AND PELVIS WITH CONTRAST TECHNIQUE: Multidetector CT imaging of the abdomen and pelvis was performed using the standard protocol following bolus administration of intravenous contrast. CONTRAST:  ISOVUE-300 IOPAMIDOL (ISOVUE-300) INJECTION 61% COMPARISON:  Radiographs 08/03/2015 FINDINGS: There is a large volume peritoneal fluid with high density  material collected dependently, characteristic of hemo peritoneum. The source of this presumed blood is not conclusive. There is a suggestion of a contrast blush in the right hemipelvis on coronal image 83 series 4 and on axial image 62 series 2 by a bleeding source is not visible. There is no extraluminal air. The spleen is intact. The liver is intact. Pancreas, adrenals and kidneys are unremarkable. Aorta and IVC are intact. Stomach and small bowel are unremarkable. No focal colonic abnormality is evident. No significant abnormality in the lower chest. No significant musculoskeletal abnormality. IMPRESSION: Large volume hemoperitoneum. No extraluminal air. Source of the presumed bleeding is not evident. These results were called by telephone at the time of interpretation on 08/03/2015 at 11:25 pm to the ER attending physician, who verbally acknowledged these results. Electronically Signed   By: Ellery Plunk M.D.   On: 08/03/2015 23:29   Dg Abd Acute W/chest  08/04/2015  CLINICAL DATA:  Known hemoperitoneum EXAM: DG ABDOMEN ACUTE W/ 1V CHEST COMPARISON:  08/03/2015 FINDINGS: Cardiac shadow is within normal limits. The lungs are clear bilaterally. Contrast material is noted within the colon consistent with a prior CT examination. No free air is noted. No abnormal mass or abnormal calcifications are seen. IMPRESSION: No acute abnormality noted. Electronically Signed   By: Alcide Clever M.D.   On: 08/04/2015 08:33   Dg Abd Acute W/chest  08/03/2015  CLINICAL DATA:  Colonoscopy earlier today now with generalized abdominal pain. EXAM: DG  ABDOMEN ACUTE W/ 1V CHEST COMPARISON:  02/26/2015 FINDINGS: Normal cardiac silhouette and mediastinal contours given patient rotation. There is a punctate (approximately 5 mm) apparent nodule overlying the peripheral aspect of the right upper/mid lung. No focal airspace opacities. No pleural effusion or pneumothorax. No evidence of edema. Nonobstructive bowel gas pattern. No  pneumoperitoneum, pneumatosis or portal venous gas. Surgical clip overlies the pelvis, potentially the sequela of prior tubal ligation, unchanged. Multiple phleboliths overlie the lower pelvis bilaterally. Otherwise, no definitive abnormal intra-abdominal calcifications. No acute osseus abnormalities. IMPRESSION: 1. No evidence of complication following recent colonoscopy. Specifically, no evidence of pneumoperitoneum. 2. No acute cardiopulmonary disease. 3. Potential 5 mm nodule overlying the peripheral aspect of the right upper/mid lung, potentially artifactual due to complex of overlying osseous structures. Correlation with prior remote examinations (if available) is recommended. Otherwise follow-up PA and lateral chest radiographs in 4-6 weeks is recommended. Electronically Signed   By: Simonne ComeJohn  Watts M.D.   On: 08/03/2015 21:01    Medications: I have reviewed the patient's current medications.  Assessment/Plan: 1. Hemoperitoneum following colonoscopy. Hemoglobin has basically been stable since yesterday WBC remains normal. She has been chronically constipated requiring multidose Miralax in the past and I think we should resume the Miralax in order to avoid constipation while her hemoperitoneum resolves. If she is able to get up and around with oral pain medicine and antibiotics she should be able to go home in the next day or so with limited activity for several days.   Marcia Walker,Shatora Weatherbee L 08/05/2015, 7:12 AM  This note was created using voice recognition software. Minor errors may Have occurred unintentionally.  Pager: [phone] If no answer or after hours call [phone]

## 2015-08-05 NOTE — Progress Notes (Signed)
Subjective:    Stable and alert.  Pain a little better.  No nausea.  Ambulating to bathroom without dizziness.  Feels pelvic pressure when bladder full but no dysuria.    Fever to 102 yesterday.  Afebrile now with heart rate 74.  SPO2 93% on room air    Hemoglobin stable.  8.3 this morning.  WBC 5200.  Creatinine 0.75.  Objective: Vital signs in last 24 hours: Temp:  [99.6 F (37.6 C)-102 F (38.9 C)] 99.8 F (37.7 C) (05/05 0400) Pulse Rate:  [61-106] 74 (05/05 0600) Resp:  [0-25] 19 (05/05 0600) BP: (88-119)/(36-57) 112/43 mmHg (05/05 0200) SpO2:  [87 %-100 %] 93 % (05/05 0600)    Intake/Output from previous day: 05/04 0701 - 05/05 0700 In: 2905 [P.O.:360; I.V.:1645; IV Piggyback:900] Out: 780 [Urine:780] Intake/Output this shift: Total I/O In: 2100 [I.V.:1200; IV Piggyback:900] Out: 780 [Urine:780]  General appearance: Alert.  Cooperative.  Mental status normal.  Mild distress.  Skin warm and dry pulse full Resp: clear to auscultation bilaterally GI: Soft.  Active bowel sounds.  Borderline distended.  Discomfort in lower abdomen and suprapubic area to palpation but no peritoneal signs.  Lab Results:   Recent Labs  08/04/15 1240 08/04/15 1750  WBC 5.0 5.2  HGB 8.8* 8.3*  HCT 26.7* 25.1*  PLT 206 200   BMET  Recent Labs  08/04/15 0619 08/05/15 0314  NA 140 142  K 3.7 3.5  CL 108 110  CO2 25 26  GLUCOSE 103* 108*  BUN 5* <5*  CREATININE 0.61 0.75  CALCIUM 8.2* 8.0*   PT/INR  Recent Labs  08/03/15 2352  LABPROT 14.3  INR 1.14   ABG No results for input(s): PHART, HCO3 in the last 72 hours.  Invalid input(s): PCO2, PO2  Studies/Results: Ct Abdomen Pelvis W Contrast  08/03/2015  CLINICAL DATA:  Abdominal pain after colonoscopy today. EXAM: CT ABDOMEN AND PELVIS WITH CONTRAST TECHNIQUE: Multidetector CT imaging of the abdomen and pelvis was performed using the standard protocol following bolus administration of intravenous contrast. CONTRAST:   ISOVUE-300 IOPAMIDOL (ISOVUE-300) INJECTION 61% COMPARISON:  Radiographs 08/03/2015 FINDINGS: There is a large volume peritoneal fluid with high density material collected dependently, characteristic of hemo peritoneum. The source of this presumed blood is not conclusive. There is a suggestion of a contrast blush in the right hemipelvis on coronal image 83 series 4 and on axial image 62 series 2 by a bleeding source is not visible. There is no extraluminal air. The spleen is intact. The liver is intact. Pancreas, adrenals and kidneys are unremarkable. Aorta and IVC are intact. Stomach and small bowel are unremarkable. No focal colonic abnormality is evident. No significant abnormality in the lower chest. No significant musculoskeletal abnormality. IMPRESSION: Large volume hemoperitoneum. No extraluminal air. Source of the presumed bleeding is not evident. These results were called by telephone at the time of interpretation on 08/03/2015 at 11:25 pm to the ER attending physician, who verbally acknowledged these results. Electronically Signed   By: Ellery Plunk M.D.   On: 08/03/2015 23:29   Dg Abd Acute W/chest  08/04/2015  CLINICAL DATA:  Known hemoperitoneum EXAM: DG ABDOMEN ACUTE W/ 1V CHEST COMPARISON:  08/03/2015 FINDINGS: Cardiac shadow is within normal limits. The lungs are clear bilaterally. Contrast material is noted within the colon consistent with a prior CT examination. No free air is noted. No abnormal mass or abnormal calcifications are seen. IMPRESSION: No acute abnormality noted. Electronically Signed   By: Eulah Pont.D.  On: 08/04/2015 08:33   Dg Abd Acute W/chest  08/03/2015  CLINICAL DATA:  Colonoscopy earlier today now with generalized abdominal pain. EXAM: DG ABDOMEN ACUTE W/ 1V CHEST COMPARISON:  02/26/2015 FINDINGS: Normal cardiac silhouette and mediastinal contours given patient rotation. There is a punctate (approximately 5 mm) apparent nodule overlying the peripheral aspect  of the right upper/mid lung. No focal airspace opacities. No pleural effusion or pneumothorax. No evidence of edema. Nonobstructive bowel gas pattern. No pneumoperitoneum, pneumatosis or portal venous gas. Surgical clip overlies the pelvis, potentially the sequela of prior tubal ligation, unchanged. Multiple phleboliths overlie the lower pelvis bilaterally. Otherwise, no definitive abnormal intra-abdominal calcifications. No acute osseus abnormalities. IMPRESSION: 1. No evidence of complication following recent colonoscopy. Specifically, no evidence of pneumoperitoneum. 2. No acute cardiopulmonary disease. 3. Potential 5 mm nodule overlying the peripheral aspect of the right upper/mid lung, potentially artifactual due to complex of overlying osseous structures. Correlation with prior remote examinations (if available) is recommended. Otherwise follow-up PA and lateral chest radiographs in 4-6 weeks is recommended. Electronically Signed   By: Simonne ComeJohn  Watts M.D.   On: 08/03/2015 21:01    Anti-infectives: Anti-infectives    Start     Dose/Rate Route Frequency Ordered Stop   08/04/15 1400  metroNIDAZOLE (FLAGYL) IVPB 500 mg     500 mg 100 mL/hr over 60 Minutes Intravenous Every 8 hours 08/04/15 1029     08/04/15 0130  ciprofloxacin (CIPRO) IVPB 400 mg     400 mg 200 mL/hr over 60 Minutes Intravenous Every 12 hours 08/04/15 0116     08/04/15 0115  cefTRIAXone (ROCEPHIN) 2 g in dextrose 5 % 50 mL IVPB  Status:  Discontinued    Comments:  Pharmacy may adjust dosing strength / duration / interval for maximal efficacy   2 g 100 mL/hr over 30 Minutes Intravenous Every 24 hours 08/04/15 0113 08/04/15 0114   08/04/15 0115  metroNIDAZOLE (FLAGYL) IVPB 500 mg  Status:  Discontinued     500 mg 100 mL/hr over 60 Minutes Intravenous Every 6 hours 08/04/15 0113 08/04/15 1029      Assessment/Plan:  Hemoperitoneum.  This occurred shortly after routine elective colonoscopy.  Imaging findings show epicenter of bleed  most likely to be in pelvis but no signs of active bleeding on infusion CT scan.  Likely rectosigmoid mesenteric bleed secondary to traction.  Clinically she is not actively bleeding.  Anticipate spontaneous resolution of hematoma over time.  No evidence of bowel perforation.  No indication for surgical intervention.    Begin dysphagia 1 diet.  Advance as tolerated    Ambulate in hall    Okay to transfer to floor from my point of view  Fever.  Significance unclear.  No leukocytosis.  No tachycardia.  Might simply be reactive due to blood in abdomen.     Incentive spirometry and ambulation recommended.  DVT prophylaxis.  SCDs only.  No anticoagulants due to recent bleed.   LOS: 1 day    Merlyn Conley M 08/05/2015

## 2015-08-05 NOTE — Progress Notes (Signed)
PROGRESS NOTE    Marcia Walker  MVH:[id number] DOB: 1956-02-28 DOA: 08/03/2015 PCP: No primary care provider on file. Outpatient Specialists: Gi Dr.Edwards  Brief Narrative: Marcia Walker is a 60 y.o. female with a history of GERD who had a Colonoscopy done 08/03/2015 by Dr Randa EvensEdwards for screening purposes who presented to the ED with complaints of increased diffuse ABD pain which began after the procedure.CT scan of the ABD were performed and the CT scan was suggestive of a Large volume Hemoperitoneum extraluminal free air was not seen   Assessment & Plan:  Hemoperitoneum -post Colonoscopy, etiology unclear -GI and CCS consulting, recommended to continue supportive Rx for now -Continue IVF and Abx -diet advanced, started on miralax -Hb stabilized, monitor Q12 -ambulate, OOB  Acute Blood loss anemia -due to above, Hb down from baseline of 13-> 10.4 -> 7.6->8.3  GERD (gastroesophageal reflux disease) -PPI    DVT prophylaxis:SCDs  Code Status: Full Code Family Communication: none at bedside Disposition Plan: Tx to floor  Consultants:  Gi and CCS Procedures:  Antimicrobials:   Subjective: Feels ok, no abd pain since earlier this am  Objective: Filed Vitals:   08/05/15 0600 08/05/15 0700 08/05/15 0800 08/05/15 1109  BP:    109/53  Pulse: 74  76 78  Temp:  99.3 F (37.4 C) 99.1 F (37.3 C) 98.5 F (36.9 C)  TempSrc:  Oral Oral Oral  Resp: 19  18 18   SpO2: 93%  93% 98%    Intake/Output Summary (Last 24 hours) at 08/05/15 1308 Last data filed at 08/05/15 1000  Gross per 24 hour  Intake 2890.42 ml  Output   1280 ml  Net 1610.42 ml   There were no vitals filed for this visit.  Examination:  General exam: Alert, awake, ill appearing Respiratory system: Clear to auscultation. Respiratory effort normal. Cardiovascular system: S1 & S2 heard, RRR. No JVD, murmurs, rubs, gallops or clicks. No pedal edema. Gastrointestinal system: Abdomen is nondistended, soft and  nontender. No organomegaly or masses felt. Normal bowel sounds  Central nervous system: Alert and oriented. No focal neurological deficits. Extremities: Symmetric 5 x 5 power. Skin: No rashes, lesions or ulcers Psychiatry: Judgement and insight appear normal. Mood & affect appropriate.     Data Reviewed: I have personally reviewed following labs and imaging studies  CBC:  Recent Labs Lab 08/03/15 2035 08/03/15 2352 08/04/15 0619 08/04/15 1240 08/04/15 1750  WBC 8.3 8.0 5.2 5.0 5.2  NEUTROABS 7.3 6.7  --   --   --   HGB 10.4* 10.1* 7.6* 8.8* 8.3*  HCT 31.2* 30.6* 23.9* 26.7* 25.1*  MCV 89.1 89.5 90.3 90.8 88.1  PLT 261 223 207 206 200   Basic Metabolic Panel:  Recent Labs Lab 08/03/15 2035 08/04/15 0619 08/05/15 0314  NA 139 140 142  K 3.8 3.7 3.5  CL 106 108 110  CO2 25 25 26   GLUCOSE 129* 103* 108*  BUN 8 5* <5*  CREATININE 0.61 0.61 0.75  CALCIUM 8.9 8.2* 8.0*   GFR: CrCl cannot be calculated (Unknown ideal weight.). Liver Function Tests:  Recent Labs Lab 08/03/15 2035  AST 23  ALT 20  ALKPHOS 60  BILITOT 0.6  PROT 6.5  ALBUMIN 3.8    Recent Labs Lab 08/03/15 2035  LIPASE 25   No results for input(s): AMMONIA in the last 168 hours. Coagulation Profile:  Recent Labs Lab 08/03/15 2352  INR 1.14   Cardiac Enzymes: No results for input(s): CKTOTAL, CKMB, CKMBINDEX, TROPONINI  in the last 168 hours. BNP (last 3 results) No results for input(s): PROBNP in the last 8760 hours. HbA1C: No results for input(s): HGBA1C in the last 72 hours. CBG: No results for input(s): GLUCAP in the last 168 hours. Lipid Profile: No results for input(s): CHOL, HDL, LDLCALC, TRIG, CHOLHDL, LDLDIRECT in the last 72 hours. Thyroid Function Tests: No results for input(s): TSH, T4TOTAL, FREET4, T3FREE, THYROIDAB in the last 72 hours. Anemia Panel: No results for input(s): VITAMINB12, FOLATE, FERRITIN, TIBC, IRON, RETICCTPCT in the last 72 hours. Urine  analysis: No results found for: COLORURINE, APPEARANCEUR, LABSPEC, PHURINE, GLUCOSEU, HGBUR, BILIRUBINUR, KETONESUR, PROTEINUR, UROBILINOGEN, NITRITE, LEUKOCYTESUR Sepsis Labs: (procalcitonin:4,lacticidven:4)  ) Recent Results (from the past 240 hour(s))  MRSA PCR Screening     Status: None   Collection Time: 08/04/15  1:00 PM  Result Value Ref Range Status   MRSA by PCR NEGATIVE NEGATIVE Final    Comment:        The GeneXpert MRSA Assay (FDA approved for NASAL specimens only), is one component of a comprehensive MRSA colonization surveillance program. It is not intended to diagnose MRSA infection nor to guide or monitor treatment for MRSA infections.          Radiology Studies: Ct Abdomen Pelvis W Contrast  08/03/2015  CLINICAL DATA:  Abdominal pain after colonoscopy today. EXAM: CT ABDOMEN AND PELVIS WITH CONTRAST TECHNIQUE: Multidetector CT imaging of the abdomen and pelvis was performed using the standard protocol following bolus administration of intravenous contrast. CONTRAST:  ISOVUE-300 IOPAMIDOL (ISOVUE-300) INJECTION 61% COMPARISON:  Radiographs 08/03/2015 FINDINGS: There is a large volume peritoneal fluid with high density material collected dependently, characteristic of hemo peritoneum. The source of this presumed blood is not conclusive. There is a suggestion of a contrast blush in the right hemipelvis on coronal image 83 series 4 and on axial image 62 series 2 by a bleeding source is not visible. There is no extraluminal air. The spleen is intact. The liver is intact. Pancreas, adrenals and kidneys are unremarkable. Aorta and IVC are intact. Stomach and small bowel are unremarkable. No focal colonic abnormality is evident. No significant abnormality in the lower chest. No significant musculoskeletal abnormality. IMPRESSION: Large volume hemoperitoneum. No extraluminal air. Source of the presumed bleeding is not evident. These results were called by  telephone at the time of interpretation on 08/03/2015 at 11:25 pm to the ER attending physician, who verbally acknowledged these results. Electronically Signed   By: Ellery Plunk M.D.   On: 08/03/2015 23:29   Dg Abd Acute W/chest  08/04/2015  CLINICAL DATA:  Known hemoperitoneum EXAM: DG ABDOMEN ACUTE W/ 1V CHEST COMPARISON:  08/03/2015 FINDINGS: Cardiac shadow is within normal limits. The lungs are clear bilaterally. Contrast material is noted within the colon consistent with a prior CT examination. No free air is noted. No abnormal mass or abnormal calcifications are seen. IMPRESSION: No acute abnormality noted. Electronically Signed   By: Alcide Clever M.D.   On: 08/04/2015 08:33   Dg Abd Acute W/chest  08/03/2015  CLINICAL DATA:  Colonoscopy earlier today now with generalized abdominal pain. EXAM: DG ABDOMEN ACUTE W/ 1V CHEST COMPARISON:  02/26/2015 FINDINGS: Normal cardiac silhouette and mediastinal contours given patient rotation. There is a punctate (approximately 5 mm) apparent nodule overlying the peripheral aspect of the right upper/mid lung. No focal airspace opacities. No pleural effusion or pneumothorax. No evidence of edema. Nonobstructive bowel gas pattern. No pneumoperitoneum, pneumatosis or portal venous gas. Surgical clip overlies the pelvis,  potentially the sequela of prior tubal ligation, unchanged. Multiple phleboliths overlie the lower pelvis bilaterally. Otherwise, no definitive abnormal intra-abdominal calcifications. No acute osseus abnormalities. IMPRESSION: 1. No evidence of complication following recent colonoscopy. Specifically, no evidence of pneumoperitoneum. 2. No acute cardiopulmonary disease. 3. Potential 5 mm nodule overlying the peripheral aspect of the right upper/mid lung, potentially artifactual due to complex of overlying osseous structures. Correlation with prior remote examinations (if available) is recommended. Otherwise follow-up PA and lateral chest radiographs in  4-6 weeks is recommended. Electronically Signed   By: Simonne Come M.D.   On: 08/03/2015 21:01        Scheduled Meds: . ciprofloxacin  400 mg Intravenous Q12H  . lip balm  1 application Topical BID  . metronidazole  500 mg Intravenous Q8H  . pantoprazole (PROTONIX) IV  40 mg Intravenous Q12H  . polyethylene glycol  17 g Oral BID   Continuous Infusions: . sodium chloride 75 mL/hr at 08/05/15 0749     LOS: 1 day    Time spent:    [name], MD [facility] Pager [phone]  If 7PM-7AM, please contact night-coverage [email or web address] Password [email or web address] 08/05/2015, 1:08 PM

## 2015-08-06 LAB — CBC
HEMATOCRIT: 25.2 % — AB (ref 36.0–46.0)
HEMATOCRIT: 25.2 % — AB (ref 36.0–46.0)
HEMATOCRIT: 26.1 % — AB (ref 36.0–46.0)
HEMOGLOBIN: 8.2 g/dL — AB (ref 12.0–15.0)
Hemoglobin: 8.5 g/dL — ABNORMAL LOW (ref 12.0–15.0)
Hemoglobin: 8.3 g/dL — ABNORMAL LOW (ref 12.0–15.0)
MCH: 28.9 pg (ref 26.0–34.0)
MCH: 29.3 pg (ref 26.0–34.0)
MCH: 29.3 pg (ref 26.0–34.0)
MCHC: 32.6 g/dL (ref 30.0–36.0)
MCHC: 32.9 g/dL (ref 30.0–36.0)
MCHC: 32.5 g/dL (ref 30.0–36.0)
MCV: 87.8 fL (ref 78.0–100.0)
MCV: 90 fL (ref 78.0–100.0)
MCV: 90 fL (ref 78.0–100.0)
PLATELETS: 212 10*3/uL (ref 150–400)
Platelets: 206 10*3/uL (ref 150–400)
Platelets: 208 10*3/uL (ref 150–400)
RBC: 2.8 MIL/uL — ABNORMAL LOW (ref 3.87–5.11)
RBC: 2.87 MIL/uL — ABNORMAL LOW (ref 3.87–5.11)
RBC: 2.9 MIL/uL — AB (ref 3.87–5.11)
RDW: 13.7 % (ref 11.5–15.5)
RDW: 13.8 % (ref 11.5–15.5)
RDW: 14 % (ref 11.5–15.5)
WBC: 4.2 10*3/uL (ref 4.0–10.5)
WBC: 4.3 10*3/uL (ref 4.0–10.5)
WBC: 5 10*3/uL (ref 4.0–10.5)

## 2015-08-06 LAB — BASIC METABOLIC PANEL
Anion gap: 9 (ref 5–15)
CHLORIDE: 107 mmol/L (ref 101–111)
CO2: 24 mmol/L (ref 22–32)
CREATININE: 0.6 mg/dL (ref 0.44–1.00)
Calcium: 8.6 mg/dL — ABNORMAL LOW (ref 8.9–10.3)
GFR calc non Af Amer: >60 mL/min (ref >60)
Glucose, Bld: 123 mg/dL — ABNORMAL HIGH (ref 65–99)
Potassium: 3.3 mmol/L — ABNORMAL LOW (ref 3.5–5.1)
Sodium: 140 mmol/L (ref 135–145)

## 2015-08-06 MED ORDER — CIPROFLOXACIN HCL 500 MG PO TABS
500.0000 mg | ORAL_TABLET | Freq: Two times a day (BID) | ORAL | Status: DC
  Administered 2015-08-06 – 2015-08-07 (×3): 500 mg via ORAL
  Filled 2015-08-06 (×3): qty 1

## 2015-08-06 MED ORDER — METRONIDAZOLE 500 MG PO TABS
500.0000 mg | ORAL_TABLET | Freq: Three times a day (TID) | ORAL | Status: DC
  Administered 2015-08-06 – 2015-08-07 (×3): 500 mg via ORAL
  Filled 2015-08-06 (×3): qty 1

## 2015-08-06 MED ORDER — PANTOPRAZOLE SODIUM 40 MG PO TBEC
40.0000 mg | DELAYED_RELEASE_TABLET | Freq: Once | ORAL | Status: AC
  Administered 2015-08-06: 40 mg via ORAL
  Filled 2015-08-06: qty 1

## 2015-08-06 NOTE — Progress Notes (Signed)
[facility] Progress Note  Subjective: Patient had a rough night last night with increased abdominal cramping and gas. Had her first small bowel movement this morning since colonoscopy. Appetite somewhat poor  Objective: Vital signs in last 24 hours: Temp:  [98.2 F (36.8 C)-99.1 F (37.3 C)] 98.9 F (37.2 C) (05/06 0544) Pulse Rate:  [70-78] 78 (05/06 0544) Resp:  [18-20] 20 (05/06 0544) BP: (101-119)/(47-59) 119/59 mmHg (05/06 0544) SpO2:  [93 %-99 %] 98 % (05/06 0544) Weight change:    PE: Unchanged  Lab Results: Results for orders placed or performed during the hospital encounter of 08/03/15 (from the past 24 hour(s))  CBC     Status: Abnormal   Collection Time: 08/05/15  6:04 PM  Result Value Ref Range   WBC 5.4 4.0 - 10.5 K/uL   RBC 2.86 (L) 3.87 - 5.11 MIL/uL   Hemoglobin 8.2 (L) 12.0 - 15.0 g/dL   HCT 69.625.0 (L) 29.536.0 - 28.446.0 %   MCV 87.4 78.0 - 100.0 fL   MCH 28.7 26.0 - 34.0 pg   MCHC 32.8 30.0 - 36.0 g/dL   RDW 13.213.6 44.011.5 - 10.215.5 %   Platelets 215 150 - 400 K/uL  CBC     Status: Abnormal   Collection Time: 08/06/15 12:01 AM  Result Value Ref Range   WBC 5.0 4.0 - 10.5 K/uL   RBC 2.80 (L) 3.87 - 5.11 MIL/uL   Hemoglobin 8.2 (L) 12.0 - 15.0 g/dL   HCT 72.525.2 (L) 36.636.0 - 44.046.0 %   MCV 90.0 78.0 - 100.0 fL   MCH 29.3 26.0 - 34.0 pg   MCHC 32.5 30.0 - 36.0 g/dL   RDW 34.713.8 42.511.5 - 95.615.5 %   Platelets 208 150 - 400 K/uL  Basic metabolic panel     Status: Abnormal   Collection Time: 08/06/15  5:59 AM  Result Value Ref Range   Sodium 140 135 - 145 mmol/L   Potassium 3.3 (L) 3.5 - 5.1 mmol/L   Chloride 107 101 - 111 mmol/L   CO2 24 22 - 32 mmol/L   Glucose, Bld 123 (H) 65 - 99 mg/dL   BUN <5 (L) 6 - 20 mg/dL   Creatinine, Ser 3.870.60 0.44 - 1.00 mg/dL   Calcium 8.6 (L) 8.9 - 10.3 mg/dL   GFR calc non Af Amer >60 >60 mL/min   GFR calc Af Amer >60 >60 mL/min   Anion gap 9 5 - 15    Studies/Results: Dg Abd Acute W/chest  08/04/2015  CLINICAL DATA:  Known  hemoperitoneum EXAM: DG ABDOMEN ACUTE W/ 1V CHEST COMPARISON:  08/03/2015 FINDINGS: Cardiac shadow is within normal limits. The lungs are clear bilaterally. Contrast material is noted within the colon consistent with a prior CT examination. No free air is noted. No abnormal mass or abnormal calcifications are seen. IMPRESSION: No acute abnormality noted. Electronically Signed   By: Alcide CleverMark  Lukens M.D.   On: 08/04/2015 08:33      Assessment: Hemoperitoneum associated with recent colonoscopy, clinically stable  Plan: Continued conservative and expectant management. Probably not read for discharge today less symptoms and appetite is improved. We'll follow-up again tomorrow.    Cardelia Sassano C 08/06/2015, 7:02 AM  Pager [phone] If no answer or after 5 PM call [phone]

## 2015-08-06 NOTE — Progress Notes (Signed)
PROGRESS NOTE    [name]  DGL:[id number] DOB: 10/22/1955 DOA: 08/03/2015 PCP: No primary care provider on file. Outpatient Specialists: Gi Dr.Edwards  Brief Narrative: Marcia Walker is a 60 y.o. female with a history of GERD who had a Colonoscopy done 08/03/2015 by Dr Randa Evens for screening purposes who presented to the ED with complaints of increased diffuse ABD pain which began after the procedure.CT scan of the ABD were performed and the CT scan was suggestive of a Large volume Hemoperitoneum extraluminal free air was not seen   Assessment & Plan:  Hemoperitoneum -post Colonoscopy, etiology unclear -GI and CCS consulting, recommended to continue supportive Rx for now -slight increase pain since last pm, but overall stable, afebrile, having multiple BMs after miralax -Po intake poor -change to PO Abx, stop IVF, ambulate, OOB -Hb stable now  Acute Blood loss anemia -due to above, Hb down from baseline of 13-> 10.4 -> 7.6->8.3 -stable since  GERD (gastroesophageal reflux disease) -PPI    DVT prophylaxis:SCDs  Code Status: Full Code Family Communication: none at bedside Disposition Plan: ? Home tomorrow  Consultants:  Gi and CCS Procedures:  Antimicrobials:   Subjective: Feels ok, no abd pain since earlier this am  Objective: Filed Vitals:   08/05/15 1416 08/05/15 2215 08/06/15 0215 08/06/15 0544  BP: 116/51  101/47 119/59  Pulse: 70  72 78  Temp: 98.6 F (37 C) 98.7 F (37.1 C) 98.2 F (36.8 C) 98.9 F (37.2 C)  TempSrc: Oral Oral Oral Oral  Resp: SpO2: 99% 94% 96% 98%    Intake/Output Summary (Last 24 hours) at 08/06/15 1026 Last data filed at 08/06/15 0700  Gross per 24 hour  Intake   2395 ml  Output      0 ml  Net   2395 ml   There were no vitals filed for this visit.  Examination:  General exam: Alert, awake, ill appearing Respiratory system: Clear to auscultation. Respiratory effort normal. Cardiovascular system: S1 & S2 heard,  RRR. No JVD, murmurs, rubs, gallops or clicks. No pedal edema. Gastrointestinal system: Abdomen is nondistended, soft and nontender. No organomegaly or masses felt. Normal bowel sounds  Central nervous system: Alert and oriented. No focal neurological deficits. Extremities: Symmetric 5 x 5 power. Skin: No rashes, lesions or ulcers Psychiatry: Judgement and insight appear normal. Mood & affect appropriate.     Data Reviewed: I have personally reviewed following labs and imaging studies  CBC:  Recent Labs Lab 08/03/15 2035 08/03/15 2352  08/04/15 1240 08/04/15 1750 08/05/15 1804 08/06/15 0001 08/06/15 0559  WBC 8.3 8.0  < > 5.0 5.2 5.4 5.0 4.3  NEUTROABS 7.3 6.7  --   --   --   --   --   --   HGB 10.4* 10.1*  < > 8.8* 8.3* 8.2* 8.2* 8.5*  HCT 31.2* 30.6*  < > 26.7* 25.1* 25.0* 25.2* 26.1*  MCV 89.1 89.5  < > 90.8 88.1 87.4 90.0 90.0  PLT 261 223  < > 206 200 215 208 212  < > = values in this interval not displayed. Basic Metabolic Panel:  Recent Labs Lab 08/03/15 2035 08/04/15 0619 08/05/15 0314 08/06/15 0559  NA 139 140 142 140  K 3.8 3.7 3.5 3.3*  CL 106 108 110 107  CO2 GLUCOSE 129* 103* 108* 123*  BUN 8 5* <5* <5*  CREATININE 0.61 0.61 0.75 0.60  CALCIUM 8.9 8.2* 8.0* 8.6*  GFR: CrCl cannot be calculated (Unknown ideal weight.). Liver Function Tests:  Recent Labs Lab 08/03/15 2035  AST 23  ALT 20  ALKPHOS 60  BILITOT 0.6  PROT 6.5  ALBUMIN 3.8    Recent Labs Lab 08/03/15 2035  LIPASE 25   No results for input(s): AMMONIA in the last 168 hours. Coagulation Profile:  Recent Labs Lab 08/03/15 2352  INR 1.14   Cardiac Enzymes: No results for input(s): CKTOTAL, CKMB, CKMBINDEX, TROPONINI in the last 168 hours. BNP (last 3 results) No results for input(s): PROBNP in the last 8760 hours. HbA1C: No results for input(s): HGBA1C in the last 72 hours. CBG: No results for input(s): GLUCAP in the last 168 hours. Lipid Profile: No  results for input(s): CHOL, HDL, LDLCALC, TRIG, CHOLHDL, LDLDIRECT in the last 72 hours. Thyroid Function Tests: No results for input(s): TSH, T4TOTAL, FREET4, T3FREE, THYROIDAB in the last 72 hours. Anemia Panel: No results for input(s): VITAMINB12, FOLATE, FERRITIN, TIBC, IRON, RETICCTPCT in the last 72 hours. Urine analysis: No results found for: COLORURINE, APPEARANCEUR, LABSPEC, PHURINE, GLUCOSEU, HGBUR, BILIRUBINUR, KETONESUR, PROTEINUR, UROBILINOGEN, NITRITE, LEUKOCYTESUR Sepsis Labs: @LABRCNTIP (procalcitonin:4,lacticidven:4)  ) Recent Results (from the past 240 hour(s))  MRSA PCR Screening     Status: None   Collection Time: 08/04/15  1:00 PM  Result Value Ref Range Status   MRSA by PCR NEGATIVE NEGATIVE Final    Comment:        The GeneXpert MRSA Assay (FDA approved for NASAL specimens only), is one component of a comprehensive MRSA colonization surveillance program. It is not intended to diagnose MRSA infection nor to guide or monitor treatment for MRSA infections.          Radiology Studies: No results found.      Scheduled Meds: . ciprofloxacin  500 mg Oral BID  . lip balm  1 application Topical BID  . metroNIDAZOLE  500 mg Oral Q8H  . pantoprazole (PROTONIX) IV  40 mg Intravenous Q12H   Continuous Infusions:     LOS: 2 days    Time spent: 35min    Zannie CovePreetha Bryson Palen, MD [facility] Pager [phone]  If 7PM-7AM, please contact night-coverage [email or web address] Password [email or web address] 08/06/2015, 10:26 AM

## 2015-08-06 NOTE — Progress Notes (Addendum)
Subjective: Has pain in pelvic area when she eats or passes gas.  No eating much.  She c/o swelling in feet and hands  Objective: Vital signs in last 24 hours: Temp:  [98.2 F (36.8 C)-98.9 F (37.2 C)] 98.9 F (37.2 C) (05/06 0544) Pulse Rate:  [70-78] 78 (05/06 0544) Resp:  [18-20] 20 (05/06 0544) BP: (101-119)/(47-59) 119/59 mmHg (05/06 0544) SpO2:  [94 %-99 %] 98 % (05/06 0544) Last BM Date: 08/05/15  Intake/Output from previous day: 05/05 0701 - 05/06 0700 In: 2740.4 [P.O.:120; I.V.:1920.4; IV Piggyback:700] Out: 500 [Urine:500] Intake/Output this shift:    PE: General- In NAD Abdomen-soft, lower abdominal tenderness Extr-1 + edema  Lab Results:   Recent Labs  08/06/15 0001 08/06/15 0559  WBC 5.0 4.3  HGB 8.2* 8.5*  HCT 25.2* 26.1*  PLT 208 212   BMET  Recent Labs  08/05/15 0314 08/06/15 0559  NA 142 140  K 3.5 3.3*  CL 110 107  CO2 26 24  GLUCOSE 108* 123*  BUN <5* <5*  CREATININE 0.75 0.60  CALCIUM 8.0* 8.6*   PT/INR  Recent Labs  08/03/15 2352  LABPROT 14.3  INR 1.14   Comprehensive Metabolic Panel:    Component Value Date/Time   NA 140 08/06/2015 0559   NA 142 08/05/2015 0314   K 3.3* 08/06/2015 0559   K 3.5 08/05/2015 0314   CL 107 08/06/2015 0559   CL 110 08/05/2015 0314   CO2 24 08/06/2015 0559   CO2 26 08/05/2015 0314   BUN <5* 08/06/2015 0559   BUN <5* 08/05/2015 0314   CREATININE 0.60 08/06/2015 0559   CREATININE 0.75 08/05/2015 0314   GLUCOSE 123* 08/06/2015 0559   GLUCOSE 108* 08/05/2015 0314   CALCIUM 8.6* 08/06/2015 0559   CALCIUM 8.0* 08/05/2015 0314   AST 23 08/03/2015 2035   AST 21 02/26/2015 2115   ALT 20 08/03/2015 2035   ALT 18 02/26/2015 2115   ALKPHOS 60 08/03/2015 2035   ALKPHOS 77 02/26/2015 2115   BILITOT 0.6 08/03/2015 2035   BILITOT 0.6 02/26/2015 2115   PROT 6.5 08/03/2015 2035   PROT 8.3* 02/26/2015 2115   ALBUMIN 3.8 08/03/2015 2035   ALBUMIN 4.8 02/26/2015 2115      Studies/Results: No results found.  Anti-infectives: Anti-infectives    Start     Dose/Rate Route Frequency Ordered Stop   08/06/15 1400  metroNIDAZOLE (FLAGYL) tablet 500 mg     500 mg Oral Every 8 hours 08/06/15 1024     08/06/15 1030  ciprofloxacin (CIPRO) tablet 500 mg     500 mg Oral 2 times daily 08/06/15 1024     08/04/15 1400  metroNIDAZOLE (FLAGYL) IVPB 500 mg  Status:  Discontinued     500 mg 100 mL/hr over 60 Minutes Intravenous Every 8 hours 08/04/15 1029 08/06/15 1024   08/04/15 0130  ciprofloxacin (CIPRO) IVPB 400 mg  Status:  Discontinued     400 mg 200 mL/hr over 60 Minutes Intravenous Every 12 hours 08/04/15 0116 08/06/15 1024   08/04/15 0115  cefTRIAXone (ROCEPHIN) 2 g in dextrose 5 % 50 mL IVPB  Status:  Discontinued    Comments:  Pharmacy may adjust dosing strength / duration / interval for maximal efficacy   2 g 100 mL/hr over 30 Minutes Intravenous Every 24 hours 08/04/15 0113 08/04/15 0114   08/04/15 0115  metroNIDAZOLE (FLAGYL) IVPB 500 mg  Status:  Discontinued     500 mg 100 mL/hr over 60 Minutes Intravenous Every  6 hours 08/04/15 0113 08/04/15 1029      Assessment Principal Problem:   Hemoperitoneum post colonoscopy-still having discomfort; no peritonitis on exam; hemoglobin stable    LOS: 2 days   Plan: No acute surgical intervention needed. Will follow.   Marcia Walker J 08/06/2015

## 2015-08-07 LAB — TYPE AND SCREEN
ABO/RH(D): A POS
Antibody Screen: NEGATIVE
UNIT DIVISION: 0
Unit division: 0
Unit division: 0
Unit division: 0

## 2015-08-07 LAB — CBC
HCT: 25.6 % — ABNORMAL LOW (ref 36.0–46.0)
HEMOGLOBIN: 8.4 g/dL — AB (ref 12.0–15.0)
MCH: 28.7 pg (ref 26.0–34.0)
MCHC: 32.8 g/dL (ref 30.0–36.0)
MCV: 87.4 fL (ref 78.0–100.0)
PLATELETS: 214 10*3/uL (ref 150–400)
RBC: 2.93 MIL/uL — AB (ref 3.87–5.11)
RDW: 13.6 % (ref 11.5–15.5)
WBC: 4.5 10*3/uL (ref 4.0–10.5)

## 2015-08-07 LAB — BASIC METABOLIC PANEL
Anion gap: 8 (ref 5–15)
BUN: <5 mg/dL — ABNORMAL LOW (ref 6–20)
CALCIUM: 8.8 mg/dL — AB (ref 8.9–10.3)
CHLORIDE: 106 mmol/L (ref 101–111)
CO2: 28 mmol/L (ref 22–32)
CREATININE: 0.72 mg/dL (ref 0.44–1.00)
GFR calc non Af Amer: >60 mL/min (ref >60)
GLUCOSE: 105 mg/dL — AB (ref 65–99)
Potassium: 3.4 mmol/L — ABNORMAL LOW (ref 3.5–5.1)
Sodium: 142 mmol/L (ref 135–145)

## 2015-08-07 LAB — C DIFFICILE QUICK SCREEN W PCR REFLEX
C Diff antigen: NEGATIVE
C Diff interpretation: NEGATIVE
C Diff toxin: NEGATIVE

## 2015-08-07 MED ORDER — CIPROFLOXACIN HCL 500 MG PO TABS: 500.0000 mg | ORAL_TABLET | Freq: Two times a day (BID) | ORAL | Status: DC

## 2015-08-07 MED ORDER — PANTOPRAZOLE SODIUM 40 MG PO TBEC
40.0000 mg | DELAYED_RELEASE_TABLET | Freq: Every day | ORAL | Status: DC
  Administered 2015-08-07: 40 mg via ORAL
  Filled 2015-08-07: qty 1

## 2015-08-07 MED ORDER — ACETAMINOPHEN 325 MG PO TABS: 650.0000 mg | ORAL_TABLET | Freq: Four times a day (QID) | ORAL | Status: DC | PRN

## 2015-08-07 MED ORDER — METRONIDAZOLE 500 MG PO TABS: 500.0000 mg | ORAL_TABLET | Freq: Three times a day (TID) | ORAL | Status: DC

## 2015-08-07 MED ORDER — HYDROCODONE-ACETAMINOPHEN 5-300 MG PO TABS: 1.0000 | ORAL_TABLET | Freq: Four times a day (QID) | ORAL | Status: DC | PRN

## 2015-08-07 MED ORDER — POLYETHYLENE GLYCOL 3350 17 G PO PACK: 17.0000 g | PACK | Freq: Every day | ORAL | Status: AC | PRN

## 2015-08-07 NOTE — Discharge Summary (Signed)
Physician Discharge Summary  Marcia Walker EAV:[id number] DOB: 10-14-1955 DOA: 08/03/2015  PCP: No primary care provider on file.  Admit date: 08/03/2015 Discharge date: 08/07/2015  Time spent: 45 minutes  Recommendations for Outpatient Follow-up:  1. Dr.Edwards in 1 week   Discharge Diagnoses:  Principal Problem:   Hemoperitoneum   Acute Blood loss anemia   GERD (gastroesophageal reflux disease)   Abdominal pain in female   S/P colonoscopy   Chronic constipation  Discharge Condition: stable  Diet recommendation: bland diet  There were no vitals filed for this visit.  History of present illness:  Marcia Walker is a 60 y.o. female with a history of GERD who had a Colonoscopy done 08/03/2015 by Dr Randa EvensEdwards for screening purposes who presented to the ED with complaints of increased diffuse ABD pain which began after the procedure.CT scan of the ABD were performed and the CT scan was suggestive of a Large volume Hemoperitoneum extraluminal free air was not seen   Hospital Course:  Hemoperitoneum -post Colonoscopy, etiology unclear -GI and CCS consulted, recommended supportive care -clinically improving, tolerating diet, off IVF and Abx changed to PO, continue CIpro/FLagyl for 10more days -Hb stable since intial drop -would expect her to have some abd pain and changes in bowel habits for several weeks until hemoperitoneum gets reabsorbed. -close FU with Dr.Edwards in 1 week  Acute Blood loss anemia -due to above, Hb down from baseline of 13-> 10.4 -> 7.6->8.3->8.2->8.5->8.3->8.4 -stable since first 24hours  GERD (gastroesophageal reflux disease) -PPI  Consultations:  GI  CCS  Discharge Exam: Filed Vitals:   08/07/15 0258 08/07/15 0602  BP: 122/63 113/61  Pulse: 79 83  Temp: 99.2 F (37.3 C) 99 F (37.2 C)  Resp: 18 18    General: AAOx3 Cardiovascular: S1S2/RRR Respiratory: CTAB  Discharge Instructions   Discharge Instructions    Discharge instructions     Complete by:  As directed   Bland diet, advance as tolerated     Increase activity slowly    Complete by:  As directed           Current Discharge Medication List    START taking these medications   Details  acetaminophen (TYLENOL) 325 MG tablet Take 2 tablets (650 mg total) by mouth every 6 (six) hours as needed for mild pain (or Fever >/= 101).    ciprofloxacin (CIPRO) 500 MG tablet Take 1 tablet (500 mg total) by mouth 2 (two) times daily. For 10days Qty: 20 tablet, Refills: 0    Hydrocodone-Acetaminophen 5-300 MG TABS Take 1 tablet by mouth every 6 (six) hours as needed. Qty: 30 each, Refills: 0    metroNIDAZOLE (FLAGYL) 500 MG tablet Take 1 tablet (500 mg total) by mouth every 8 (eight) hours. For 10days Qty: 30 tablet, Refills: 0      CONTINUE these medications which have CHANGED   Details  polyethylene glycol (MIRALAX) packet Take 17 g by mouth daily as needed for mild constipation. Take up to 3 times daily until bowels move. Refills: 0      CONTINUE these medications which have NOT CHANGED   Details  cetirizine (ZYRTEC) 10 MG tablet Take 10 mg by mouth daily as needed. For allergies    cycloSPORINE (RESTASIS) 0.05 % ophthalmic emulsion Place 1 drop into both eyes at bedtime as needed (for allergy eye).     albuterol (PROVENTIL HFA;VENTOLIN HFA) 108 (90 Base) MCG/ACT inhaler Inhale 1-2 puffs into the lungs every 6 (six) hours as needed for wheezing  or shortness of breath. Qty: 1 Inhaler, Refills: 0    EPINEPHrine 0.3 mg/0.3 mL IJ SOAJ injection Inject 0.3 mLs (0.3 mg total) into the muscle once. Qty: 1 Device, Refills: 1    pantoprazole (PROTONIX) 20 MG tablet Take 1 tablet (20 mg total) by mouth daily. Qty: 20 tablet, Refills: 0    Spacer/Aero-Holding Chambers (AEROCHAMBER PLUS) inhaler Use as instructed Qty: 1 each, Refills: 2      STOP taking these medications     naproxen sodium (ANAPROX) 220 MG tablet      sodium phosphate (FLEET) enema         Allergies  Allergen Reactions  . Prednisone Diarrhea    "moody, paranoid, diarrhea running out of me"  . Amitiza [Lubiprostone]     Chest pain  . Claritin [Loratadine] Other (See Comments)    hallucinations  . Other Hives    Acidic foods-  . Penicillins Other (See Comments)    Has patient had a PCN reaction causing immediate rash, facial/tongue/throat swelling, SOB or lightheadedness with hypotension:No Has patient had a PCN reaction causing severe rash involving mucus membranes or skin necrosis: No Has patient had a PCN reaction that required hospitalization:Yes Has patient had a PCN reaction occurring within the last 10 years:No If all of the above answers are "NO", then may proceed with Cephalosporin use. Hallucinations.  "makes pt crazy"  . Propoxyphene Other (See Comments)    "high as a kite"  . Vibramycin [Doxycycline Monohydrate] Other (See Comments)    Made jaw twist and tongue felt like it was swelling   Follow-up Information    Follow up with EDWARDS JR,JAMES L, MD. Schedule an appointment as soon as possible for a visit in 1 week.   Specialty:  Gastroenterology   Contact information:   [address] [place] [address] [phone]        The results of significant diagnostics from this hospitalization (including imaging, microbiology, ancillary and laboratory) are listed below for reference.    Significant Diagnostic Studies: Ct Abdomen Pelvis W Contrast  08/03/2015  CLINICAL DATA:  Abdominal pain after colonoscopy today. EXAM: CT ABDOMEN AND PELVIS WITH CONTRAST TECHNIQUE: Multidetector CT imaging of the abdomen and pelvis was performed using the standard protocol following bolus administration of intravenous contrast. CONTRAST:  ISOVUE-300 IOPAMIDOL (ISOVUE-300) INJECTION 61% COMPARISON:  Radiographs 08/03/2015 FINDINGS: There is a large volume peritoneal fluid with high density material collected dependently, characteristic of hemo  peritoneum. The source of this presumed blood is not conclusive. There is a suggestion of a contrast blush in the right hemipelvis on coronal image 83 series 4 and on axial image 62 series 2 by a bleeding source is not visible. There is no extraluminal air. The spleen is intact. The liver is intact. Pancreas, adrenals and kidneys are unremarkable. Aorta and IVC are intact. Stomach and small bowel are unremarkable. No focal colonic abnormality is evident. No significant abnormality in the lower chest. No significant musculoskeletal abnormality. IMPRESSION: Large volume hemoperitoneum. No extraluminal air. Source of the presumed bleeding is not evident. These results were called by telephone at the time of interpretation on 08/03/2015 at 11:25 pm to the ER attending physician, who verbally acknowledged these results. Electronically Signed   By: Ellery Plunk M.D.   On: 08/03/2015 23:29   Dg Abd Acute W/chest  08/04/2015  CLINICAL DATA:  Known hemoperitoneum EXAM: DG ABDOMEN ACUTE W/ 1V CHEST COMPARISON:  08/03/2015 FINDINGS: Cardiac shadow is within normal limits.  The lungs are clear bilaterally. Contrast material is noted within the colon consistent with a prior CT examination. No free air is noted. No abnormal mass or abnormal calcifications are seen. IMPRESSION: No acute abnormality noted. Electronically Signed   By: Alcide Clever M.D.   On: 08/04/2015 08:33   Dg Abd Acute W/chest  08/03/2015  CLINICAL DATA:  Colonoscopy earlier today now with generalized abdominal pain. EXAM: DG ABDOMEN ACUTE W/ 1V CHEST COMPARISON:  02/26/2015 FINDINGS: Normal cardiac silhouette and mediastinal contours given patient rotation. There is a punctate (approximately 5 mm) apparent nodule overlying the peripheral aspect of the right upper/mid lung. No focal airspace opacities. No pleural effusion or pneumothorax. No evidence of edema. Nonobstructive bowel gas pattern. No pneumoperitoneum, pneumatosis or portal venous gas. Surgical  clip overlies the pelvis, potentially the sequela of prior tubal ligation, unchanged. Multiple phleboliths overlie the lower pelvis bilaterally. Otherwise, no definitive abnormal intra-abdominal calcifications. No acute osseus abnormalities. IMPRESSION: 1. No evidence of complication following recent colonoscopy. Specifically, no evidence of pneumoperitoneum. 2. No acute cardiopulmonary disease. 3. Potential 5 mm nodule overlying the peripheral aspect of the right upper/mid lung, potentially artifactual due to complex of overlying osseous structures. Correlation with prior remote examinations (if available) is recommended. Otherwise follow-up PA and lateral chest radiographs in 4-6 weeks is recommended. Electronically Signed   By: Simonne Come M.D.   On: 08/03/2015 21:01    Microbiology: Recent Results (from the past 240 hour(s))  MRSA PCR Screening     Status: None   Collection Time: 08/04/15  1:00 PM  Result Value Ref Range Status   MRSA by PCR NEGATIVE NEGATIVE Final    Comment:        The GeneXpert MRSA Assay (FDA approved for NASAL specimens only), is one component of a comprehensive MRSA colonization surveillance program. It is not intended to diagnose MRSA infection nor to guide or monitor treatment for MRSA infections.   C difficile quick scan w PCR reflex     Status: None   Collection Time: 08/07/15 12:00 AM  Result Value Ref Range Status   C Diff antigen NEGATIVE NEGATIVE Final   C Diff toxin NEGATIVE NEGATIVE Final   C Diff interpretation Negative for toxigenic C. difficile  Final     Labs: Basic Metabolic Panel:  Recent Labs Lab 08/03/15 2035 08/04/15 0619 08/05/15 0314 08/06/15 0559 08/07/15 0552  NA 139 140 142 140 142  K 3.8 3.7 3.5 3.3* 3.4*  CL 106 108 110 107 106  CO2 25 25 26 24 28   GLUCOSE 129* 103* 108* 123* 105*  BUN 8 5* <5* <5* <5*  CREATININE 0.61 0.61 0.75 0.60 0.72  CALCIUM 8.9 8.2* 8.0* 8.6* 8.8*   Liver Function Tests:  Recent Labs Lab  08/03/15 2035  AST 23  ALT 20  ALKPHOS 60  BILITOT 0.6  PROT 6.5  ALBUMIN 3.8    Recent Labs Lab 08/03/15 2035  LIPASE 25   No results for input(s): AMMONIA in the last 168 hours. CBC:  Recent Labs Lab 08/03/15 2035 08/03/15 2352  08/05/15 1804 08/06/15 0001 08/06/15 0559 08/06/15 1757 08/07/15 0552  WBC 8.3 8.0  < > 5.4 5.0 4.3 4.2 4.5  NEUTROABS 7.3 6.7  --   --   --   --   --   --   HGB 10.4* 10.1*  < > 8.2* 8.2* 8.5* 8.3* 8.4*  HCT 31.2* 30.6*  < > 25.0* 25.2* 26.1* 25.2* 25.6*  MCV 89.1 89.5  < >  87.4 90.0 90.0 87.8 87.4  PLT 261 223  < > 215 208 212 206 214  < > = values in this interval not displayed. Cardiac Enzymes: No results for input(s): CKTOTAL, CKMB, CKMBINDEX, TROPONINI in the last 168 hours. BNP: BNP (last 3 results) No results for input(s): BNP in the last 8760 hours.  ProBNP (last 3 results) No results for input(s): PROBNP in the last 8760 hours.  CBG: No results for input(s): GLUCAP in the last 168 hours.     SignedZannie Cove MD.  [facility] 08/07/2015, 11:54 AM

## 2015-08-07 NOTE — Progress Notes (Signed)
Pt seen and examined, lot of diarrhea and pain last night, but overall improved now Hb and WBC stable IF remains stable today will DC home on Vicodin/Cipro/Flagyl with close GI FU if ok with CCS  Zannie CovePreetha Schneur Crowson, MD

## 2015-08-07 NOTE — Progress Notes (Signed)
Patient ID: Marcia Walker, female   DOB: [birth year], 60 y.o.   MRN: [id number]  [facility]  Subjective: Patient up in room walking, complains of loose BM's this AM.  Objective: Vital signs in last 24 hours: Temp:  [98.3 F (36.8 C)-99.2 F (37.3 C)] 99 F (37.2 C) (05/07 0602) Pulse Rate:  [74-83] 83 (05/07 0602) Resp:  [18] 18 (05/07 0602) BP: (113-129)/(61-70) 113/61 mmHg (05/07 0602) SpO2:  [97 %-99 %] 97 % (05/07 0602) Last BM Date: 08/06/15  Intake/Output from previous day:   Intake/Output this shift: Total I/O In: 240 [P.O.:240] Out: -   Physical Exam: HEENT - sclerae clear, mucous membranes moist Abdomen - mild distension; non-tender Ext - no edema, non-tender Neuro - alert & oriented, no focal deficits  Lab Results:   Recent Labs  08/06/15 1757 08/07/15 0552  WBC 4.2 4.5  HGB 8.3* 8.4*  HCT 25.2* 25.6*  PLT 206 214   BMET  Recent Labs  08/06/15 0559 08/07/15 0552  NA 140 142  K 3.3* 3.4*  CL 107 106  CO2 24 28  GLUCOSE 123* 105*  BUN <5* <5*  CREATININE 0.60 0.72  CALCIUM 8.6* 8.8*   PT/INR No results for input(s): LABPROT, INR in the last 72 hours. Comprehensive Metabolic Panel:    Component Value Date/Time   NA 142 08/07/2015 0552   NA 140 08/06/2015 0559   K 3.4* 08/07/2015 0552   K 3.3* 08/06/2015 0559   CL 106 08/07/2015 0552   CL 107 08/06/2015 0559   CO2 28 08/07/2015 0552   CO2 24 08/06/2015 0559   BUN <5* 08/07/2015 0552   BUN <5* 08/06/2015 0559   CREATININE 0.72 08/07/2015 0552   CREATININE 0.60 08/06/2015 0559   GLUCOSE 105* 08/07/2015 0552   GLUCOSE 123* 08/06/2015 0559   CALCIUM 8.8* 08/07/2015 0552   CALCIUM 8.6* 08/06/2015 0559   AST 23 08/03/2015 2035   AST 21 02/26/2015 2115   ALT 20 08/03/2015 2035   ALT 18 02/26/2015 2115   ALKPHOS 60 08/03/2015 2035   ALKPHOS 77 02/26/2015 2115   BILITOT 0.6 08/03/2015 2035   BILITOT 0.6 02/26/2015 2115   PROT 6.5 08/03/2015 2035   PROT 8.3* 02/26/2015 2115   ALBUMIN 3.8 08/03/2015 2035   ALBUMIN 4.8 02/26/2015 2115    Studies/Results: No results found.  Assessment & Plans: Hemoperitoneum post colonoscopy  Clinically improving  Hgb stable  OK to treat diarrhea with Kaopectate if needed  OK for discharge from surgical standpoint  Will sign off - call if needed  Marcia Hecklerodd M. Paighton Godette, MD, [facility] Office: [phone]   Marcia Walker 08/07/2015

## 2015-08-07 NOTE — Progress Notes (Signed)
[facility] Progress Note  Subjective: A lot of diarrhea last night caused irritation of the perianal area, now with some recurrent right lower quadrant pain. He has not gotten out of bed or breakfast.  Objective: Vital signs in last 24 hours: Temp:  [98.3 F (36.8 C)-99.2 F (37.3 C)] 99 F (37.2 C) (05/07 0602) Pulse Rate:  [74-83] 83 (05/07 0602) Resp:  [18] 18 (05/07 0602) BP: (113-129)/(61-70) 113/61 mmHg (05/07 0602) SpO2:  [97 %-99 %] 97 % (05/07 0602) Weight change:    PE: Mild right lower quadrant tenderness with some guarding  Lab Results: Results for orders placed or performed during the hospital encounter of 08/03/15 (from the past 24 hour(s))  CBC     Status: Abnormal   Collection Time: 08/06/15  5:57 PM  Result Value Ref Range   WBC 4.2 4.0 - 10.5 K/uL   RBC 2.87 (L) 3.87 - 5.11 MIL/uL   Hemoglobin 8.3 (L) 12.0 - 15.0 g/dL   HCT 63.825.2 (L) 75.636.0 - 43.346.0 %   MCV 87.8 78.0 - 100.0 fL   MCH 28.9 26.0 - 34.0 pg   MCHC 32.9 30.0 - 36.0 g/dL   RDW 29.513.7 18.811.5 - 41.615.5 %   Platelets 206 150 - 400 K/uL  C difficile quick scan w PCR reflex     Status: None   Collection Time: 08/07/15 12:00 AM  Result Value Ref Range   C Diff antigen NEGATIVE NEGATIVE   C Diff toxin NEGATIVE NEGATIVE   C Diff interpretation Negative for toxigenic C. difficile   Basic metabolic panel     Status: Abnormal   Collection Time: 08/07/15  5:52 AM  Result Value Ref Range   Sodium 142 135 - 145 mmol/L   Potassium 3.4 (L) 3.5 - 5.1 mmol/L   Chloride 106 101 - 111 mmol/L   CO2 28 22 - 32 mmol/L   Glucose, Bld 105 (H) 65 - 99 mg/dL   BUN <5 (L) 6 - 20 mg/dL   Creatinine, Ser 6.060.72 0.44 - 1.00 mg/dL   Calcium 8.8 (L) 8.9 - 10.3 mg/dL   GFR calc non Af Amer >60 >60 mL/min   GFR calc Af Amer >60 >60 mL/min   Anion gap 8 5 - 15  CBC     Status: Abnormal   Collection Time: 08/07/15  5:52 AM  Result Value Ref Range   WBC 4.5 4.0 - 10.5 K/uL   RBC 2.93 (L) 3.87 - 5.11 MIL/uL   Hemoglobin  8.4 (L) 12.0 - 15.0 g/dL   HCT 30.125.6 (L) 60.136.0 - 09.346.0 %   MCV 87.4 78.0 - 100.0 fL   MCH 28.7 26.0 - 34.0 pg   MCHC 32.8 30.0 - 36.0 g/dL   RDW 23.513.6 57.311.5 - 22.015.5 %   Platelets 214 150 - 400 K/uL    Studies/Results: No results found.    Assessment: Hemoperitoneum asked her colonoscopy, clinically stable, symptoms slow to improve.  Plan: Observe symptomatology today, discharge when she is comfortable. IV access gone. Requesting something stronger than Tylenol for pain.    Marcia Walker C 08/07/2015, 8:18 AM  Pager [phone] If no answer or after 5 PM call [phone]

## 2015-09-05 ENCOUNTER — Telehealth: Payer: Self-pay | Admitting: Internal Medicine

## 2015-09-06 NOTE — Telephone Encounter (Signed)
Dr Rhea BeltonPyrtle has reviewed patient's records [facility]. He writes: "patient with recent colonoscopy followed by hemoperitoneum. Was seen in ED but not admitted. I think she should follow up with [facility] or tertiary care." Note given to Referrals.

## 2015-09-06 NOTE — Telephone Encounter (Signed)
Pt notified of decision.

## 2016-01-17 ENCOUNTER — Ambulatory Visit (INDEPENDENT_AMBULATORY_CARE_PROVIDER_SITE_OTHER): Payer: BLUE CROSS/BLUE SHIELD | Admitting: Orthopaedic Surgery

## 2016-01-17 DIAGNOSIS — M65331 Trigger finger, right middle finger: Secondary | ICD-10-CM | POA: Diagnosis not present

## 2016-07-13 ENCOUNTER — Other Ambulatory Visit: Payer: Self-pay | Admitting: Otolaryngology

## 2016-07-13 ENCOUNTER — Ambulatory Visit
Admission: RE | Admit: 2016-07-13 | Discharge: 2016-07-13 | Disposition: A | Discharge status: Home / Self Care | Payer: BLUE CROSS/BLUE SHIELD | Source: Ambulatory Visit | Attending: Otolaryngology | Admitting: Otolaryngology

## 2016-07-13 DIAGNOSIS — J320 Chronic maxillary sinusitis: Secondary | ICD-10-CM

## 2016-07-27 ENCOUNTER — Encounter: Payer: Self-pay | Admitting: Obstetrics & Gynecology

## 2016-07-27 ENCOUNTER — Ambulatory Visit (INDEPENDENT_AMBULATORY_CARE_PROVIDER_SITE_OTHER): Payer: BLUE CROSS/BLUE SHIELD | Admitting: Obstetrics & Gynecology

## 2016-07-27 VITALS — BP 110/70 | Ht 62.0 in | Wt 107.0 lb

## 2016-07-27 DIAGNOSIS — N952 Postmenopausal atrophic vaginitis: Secondary | ICD-10-CM | POA: Diagnosis not present

## 2016-07-27 DIAGNOSIS — Z01411 Encounter for gynecological examination (general) (routine) with abnormal findings: Secondary | ICD-10-CM | POA: Diagnosis not present

## 2016-07-27 DIAGNOSIS — A6004 Herpesviral vulvovaginitis: Secondary | ICD-10-CM | POA: Diagnosis not present

## 2016-07-27 MED ORDER — VALACYCLOVIR HCL 500 MG PO TABS: 500.0000 mg | ORAL_TABLET | Freq: Every day | ORAL | 1 refills | Status: AC

## 2016-07-27 NOTE — Patient Instructions (Signed)
Your Annual/Gyn exam was normal today.  A Pap test was done.  I will let you know the result as soon as available.  Please schedule your [facility] and follow-up here for a Bone Density. Marcia Walker, it was a pleasure to see you today!

## 2016-07-27 NOTE — Addendum Note (Signed)
Addended by: Dayna Barker on: 07/27/2016 03:53 PM   Modules accepted: Orders

## 2016-07-27 NOTE — Progress Notes (Signed)
    Marcia Walker [birth year] [id number]   History:    61 y.o. 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  No HRT.  No PMB.  No pelvic pain.  H/O Uterine Fibroids, ASxic.  Active physically.  BMI 19+.  Had a Colonoscopy complicated by a Hemoperitoneum which resolved with observation in ICU.  Hb dropped to 7+.  Past medical history,surgical history, family history and social history were all reviewed and documented in the EPIC chart.  Gynecologic History No LMP recorded. Patient is postmenopausal. Contraception: post menopausal status Last Pap: 2016. Results were: normal Last mammogram: 2016. Results were: normal  Obstetric History OB History  No data available     ROS: A ROS was performed and pertinent positives and negatives are included in the history.  GENERAL: No fevers or chills. HEENT: No change in vision, no earache, sore throat or sinus congestion. NECK: No pain or stiffness. CARDIOVASCULAR: No chest pain or pressure. No palpitations. PULMONARY: No shortness of breath, cough or wheeze. GASTROINTESTINAL: No abdominal pain, nausea, vomiting or diarrhea, melena or bright red blood per rectum. GENITOURINARY: No urinary frequency, urgency, hesitancy or dysuria. MUSCULOSKELETAL: No joint or muscle pain, no back pain, no recent trauma. DERMATOLOGIC: No rash, no itching, no lesions. ENDOCRINE: No polyuria, polydipsia, no heat or cold intolerance. No recent change in weight. HEMATOLOGICAL: No anemia or easy bruising or bleeding. NEUROLOGIC: No headache, seizures, numbness, tingling or weakness. PSYCHIATRIC: No depression, no loss of interest in normal activity or change in sleep pattern.     Exam:   BP 110/70   Ht  (1.575 m)   Wt 107 lb (48.5 kg)   BMI 19.57 kg/m   Body mass index is 19.57 kg/m.  General appearance : Well developed well nourished female. No acute distress HEENT: Eyes: no retinal hemorrhage or exudates,  Neck supple,  trachea midline, no carotid bruits, no thyroidmegaly Lungs: Clear to auscultation, no rhonchi or wheezes, or rib retractions  Heart: Regular rate and rhythm, no murmurs or gallops Breast:Examined in sitting and supine position were symmetrical in appearance, no palpable masses or tenderness,  no skin retraction, no nipple inversion, no nipple discharge, no skin discoloration, no axillary or supraclavicular lymphadenopathy Abdomen: no palpable masses or tenderness, no rebound or guarding Extremities: no edema or skin discoloration or tenderness  Pelvic:  Bartholin, Urethra, Skene Glands: Within normal limits             Vagina: No gross lesions or discharge  Cervix: No gross lesions or discharge.  Pap done.  Uterus  AV, normal size, shape and consistency, non-tender and mobile  Adnexa  Without masses or tenderness  Anus and perineum  normal      Assessment/Plan:  62 y.o. female for annual exam   1. Encounter for gynecological examination with abnormal finding Normal Gyn exam.  Pap reflex pending.  Schedule screening mammo.  F/U BD here.  2. Post-menopausal atrophic vaginitis   ASxic, no treatment needed.  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  Genia Del MD, 3:22 PM 07/27/2016

## 2016-07-31 LAB — PAP IG W/ RFLX HPV ASCU

## 2016-11-20 ENCOUNTER — Other Ambulatory Visit: Payer: Self-pay | Admitting: Family Medicine

## 2016-11-20 DIAGNOSIS — Z1231 Encounter for screening mammogram for malignant neoplasm of breast: Secondary | ICD-10-CM

## 2016-11-26 ENCOUNTER — Ambulatory Visit: Payer: BLUE CROSS/BLUE SHIELD

## 2016-11-26 ENCOUNTER — Ambulatory Visit
Admission: RE | Admit: 2016-11-26 | Discharge: 2016-11-26 | Disposition: A | Discharge status: Home / Self Care | Payer: BLUE CROSS/BLUE SHIELD | Source: Ambulatory Visit | Attending: Family Medicine | Admitting: Family Medicine

## 2016-11-26 DIAGNOSIS — Z1231 Encounter for screening mammogram for malignant neoplasm of breast: Secondary | ICD-10-CM

## 2017-05-13 ENCOUNTER — Ambulatory Visit (INDEPENDENT_AMBULATORY_CARE_PROVIDER_SITE_OTHER): Payer: BLUE CROSS/BLUE SHIELD | Admitting: Orthopaedic Surgery

## 2017-05-13 ENCOUNTER — Other Ambulatory Visit (INDEPENDENT_AMBULATORY_CARE_PROVIDER_SITE_OTHER): Payer: Self-pay

## 2017-05-13 ENCOUNTER — Telehealth (INDEPENDENT_AMBULATORY_CARE_PROVIDER_SITE_OTHER): Payer: Self-pay | Admitting: Orthopaedic Surgery

## 2017-05-13 ENCOUNTER — Ambulatory Visit (INDEPENDENT_AMBULATORY_CARE_PROVIDER_SITE_OTHER): Payer: Self-pay

## 2017-05-13 ENCOUNTER — Encounter (INDEPENDENT_AMBULATORY_CARE_PROVIDER_SITE_OTHER): Payer: Self-pay | Admitting: Orthopaedic Surgery

## 2017-05-13 VITALS — BP 159/66 | HR 77 | Resp 14 | Ht 62.0 in | Wt 110.0 lb

## 2017-05-13 DIAGNOSIS — M545 Low back pain: Secondary | ICD-10-CM | POA: Diagnosis not present

## 2017-05-13 DIAGNOSIS — M542 Cervicalgia: Secondary | ICD-10-CM

## 2017-05-13 DIAGNOSIS — M5441 Lumbago with sciatica, right side: Secondary | ICD-10-CM | POA: Diagnosis not present

## 2017-05-13 MED ORDER — DICLOFENAC SODIUM 1 % TD GEL: 4.0000 g | Freq: Three times a day (TID) | TRANSDERMAL | 4 refills | Status: DC | PRN

## 2017-05-13 MED ORDER — METHOCARBAMOL 500 MG PO TABS: 500.0000 mg | ORAL_TABLET | Freq: Two times a day (BID) | ORAL | 0 refills | Status: DC | PRN

## 2017-05-13 NOTE — Telephone Encounter (Signed)
Patient called stating that she would like to get her prescription for Voltaren Gel at [facility] [address] [place].   They need a prescription faxed to # [phone]

## 2017-05-13 NOTE — Telephone Encounter (Signed)
Sent rx to pharmacy
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
## 2017-05-16 ENCOUNTER — Other Ambulatory Visit (INDEPENDENT_AMBULATORY_CARE_PROVIDER_SITE_OTHER): Payer: Self-pay

## 2017-05-16 MED ORDER — DICLOFENAC SODIUM 1 % TD GEL: 4.0000 g | Freq: Three times a day (TID) | TRANSDERMAL | 4 refills | Status: DC | PRN

## 2017-05-20 ENCOUNTER — Telehealth (INDEPENDENT_AMBULATORY_CARE_PROVIDER_SITE_OTHER): Payer: Self-pay | Admitting: Orthopaedic Surgery

## 2017-05-20 NOTE — Telephone Encounter (Signed)
Patient calling to let you know insurance will not cover Voltaren gel. Pharmacy was suppose to send paperwork to be filled out for prior auth.

## 2017-05-21 NOTE — Telephone Encounter (Signed)
Still trying to get this approved.

## 2017-05-23 ENCOUNTER — Telehealth: Payer: Self-pay | Admitting: Rheumatology

## 2017-05-23 NOTE — Telephone Encounter (Signed)
faxed

## 2017-05-23 NOTE — Telephone Encounter (Signed)
[employer] left a message 05/22/17 to advise they received medication request for patient that was submitted on incorrect form. They will be faxing correct form, and request you fill out questions 1-5. Please include reference # [id number].

## 2017-06-05 ENCOUNTER — Telehealth (INDEPENDENT_AMBULATORY_CARE_PROVIDER_SITE_OTHER): Payer: Self-pay | Admitting: Orthopaedic Surgery

## 2017-06-05 NOTE — Telephone Encounter (Signed)
Patient calling to let you know she is still having pain while sitting  Foe a period of time/ driving, or bending over. Patient has been using muscle relaxer, Voltaren gel ,hot bath, but still having pain. Please call to advise what to try or do next.

## 2017-06-06 NOTE — Telephone Encounter (Signed)
Would you like the pt. To come in for appt? Please advise what to do. Thank you.

## 2017-06-06 NOTE — Telephone Encounter (Signed)
PT IS HAVING LOWER BACK PAIN THAT RADIATES TO RIGHT HIP. RIGHT HIP ALSO HAVING SWELLING ON LATERAL SIDE. NOT SURE IF YOU WANT JUST MRI LUMBAR OR IF YOU ALSO WANT A PELVIS W RIGHT HIP? PLEASE LET ME KNOW SO I CAN SEND REFFERAL IN. THANK YOU!!

## 2017-06-06 NOTE — Telephone Encounter (Signed)
If still having low back pain then needs an MRI of lumbar spine-please call to be sure pain is mostly in back

## 2017-06-07 ENCOUNTER — Other Ambulatory Visit (INDEPENDENT_AMBULATORY_CARE_PROVIDER_SITE_OTHER): Payer: Self-pay | Admitting: Radiology

## 2017-06-07 DIAGNOSIS — M545 Low back pain: Secondary | ICD-10-CM

## 2017-06-07 NOTE — Progress Notes (Signed)
MRI

## 2017-06-07 NOTE — Telephone Encounter (Signed)
MRI L SPINE ORDERED

## 2017-06-07 NOTE — Telephone Encounter (Signed)
MRI L-S spine

## 2017-06-12 ENCOUNTER — Telehealth (INDEPENDENT_AMBULATORY_CARE_PROVIDER_SITE_OTHER): Payer: Self-pay | Admitting: Orthopaedic Surgery

## 2017-06-12 NOTE — Telephone Encounter (Signed)
I called [facility], order received, I called patient, patient will call [facility] to schedule appt that is convenient for her then call office to schedule f/u appt

## 2017-06-12 NOTE — Telephone Encounter (Signed)
Patient called stating she would like a return call immediately regarding her MRI.  Patient states that she was not given any information regarding her MRI (what body part she needed xrayed, when and where the procedure would take place) and feels that the office is "dropping the ball" regarding keeping her updated on her care.

## 2017-06-16 ENCOUNTER — Ambulatory Visit
Admission: RE | Admit: 2017-06-16 | Discharge: 2017-06-16 | Disposition: A | Discharge status: Home / Self Care | Payer: Self-pay | Source: Ambulatory Visit | Attending: Orthopaedic Surgery | Admitting: Orthopaedic Surgery

## 2017-06-16 DIAGNOSIS — M545 Low back pain: Secondary | ICD-10-CM

## 2017-06-21 ENCOUNTER — Ambulatory Visit (INDEPENDENT_AMBULATORY_CARE_PROVIDER_SITE_OTHER): Payer: BLUE CROSS/BLUE SHIELD | Admitting: Orthopaedic Surgery

## 2017-06-21 ENCOUNTER — Encounter (INDEPENDENT_AMBULATORY_CARE_PROVIDER_SITE_OTHER): Payer: Self-pay | Admitting: Orthopaedic Surgery

## 2017-06-21 ENCOUNTER — Encounter (INDEPENDENT_AMBULATORY_CARE_PROVIDER_SITE_OTHER): Payer: Self-pay

## 2017-06-21 DIAGNOSIS — M545 Low back pain: Secondary | ICD-10-CM | POA: Diagnosis not present
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
## 2017-07-03 ENCOUNTER — Ambulatory Visit: Payer: BLUE CROSS/BLUE SHIELD | Attending: Orthopaedic Surgery | Admitting: Physical Therapy

## 2017-07-03 ENCOUNTER — Encounter: Payer: Self-pay | Admitting: Physical Therapy

## 2017-07-03 DIAGNOSIS — M5441 Lumbago with sciatica, right side: Secondary | ICD-10-CM | POA: Diagnosis not present

## 2017-07-03 DIAGNOSIS — M6281 Muscle weakness (generalized): Secondary | ICD-10-CM | POA: Diagnosis present

## 2017-07-03 NOTE — Therapy (Addendum)
[facility] [address] [place], [place], [address] Phone: [phone]   Fax:  [phone]  Physical Therapy Evaluation  Patient Details  Name: Marcia Walker MRN: [id number] Date of Birth: 1955-11-20 Referring Provider: Norlene Campbell, MD   Encounter Date: 07/03/2017  PT End of Session - 07/08/17 0903    Visit Number  2    Number of Visits  16    Date for PT Re-Evaluation  09/02/17    PT Start Time  0848    PT Stop Time  0935    PT Time Calculation (min)  47 min    Activity Tolerance  Patient tolerated treatment well    Behavior During Therapy  [facility] for tasks assessed/performed       Past Medical History:  Diagnosis Date  . Felon of finger of right hand with lymphangitis 12/17/2011  . GERD (gastroesophageal reflux disease)   . Headache(784.0)    with allergies    Past Surgical History:  Procedure Laterality Date  . I&D EXTREMITY  12/17/2011   Procedure: IRRIGATION AND DEBRIDEMENT EXTREMITY;  Surgeon: Marlowe Shores, MD;  Location: [facility] OR;  Service: Orthopedics;  Laterality: Right;  Right Ring Finger  . left shouler manipulation  01/2010  . OTHER SURGICAL HISTORY  15 yrs ago   cyst removed from end of spine  . right shoulder manipulation 03/2011  03/2011  . UTERINE FIBROID SURGERY  2007    There were no vitals filed for this visit.   Subjective Assessment - 07/08/17 0855    Subjective  Pt arriving to therapy reporting 7/10 pain with sitting. Pt reporting that after her last visit her R leg was much better but her left leg began hurting. Pt reporting after sitting in the lobby and driving here her pain is worse.     Pertinent History  L3-4 lateral foraminal protrusion, L3-4 nerve root irritation    Patient Stated Goals  Stop hurting, be able to work without pain    Currently in Pain?  Yes    Pain Score  7     Pain Location  Back    Pain Orientation  Right    Pain Descriptors / Indicators  Aching    Pain Type   Acute pain    Pain Onset  More than a month ago    Pain Frequency  Constant                    Objective measurements completed on examination: See above findings.      [facility] Adult PT Treatment/Exercise - 07/08/17 0001      Lumbar Exercises: Stretches   Single Knee to Chest Stretch  Right;2 reps;10 seconds    Piriformis Stretch  Right;3 reps;10 seconds    Figure 4 Stretch  2 reps;10 seconds      Lumbar Exercises: Aerobic   Nustep  L4 x 5 minutes      Lumbar Exercises: Supine   Clam  10 reps;5 seconds;Limitations    Clam Limitations  green theraband    Bridge  10 reps;5 seconds;Limitations    Bridge Limitations  arms reaching toward ceiling    Straight Leg Raise  5 reps;3 seconds;Limitations    Straight Leg Raises Limitations  2 sets 2# ankle weight      Modalities   Modalities  Traction      Traction   Type of Traction  Lumbar    Min (lbs)  20    Max (  lbs)  50    Time  17 minutes             PT Education - 07/08/17 0903    Education provided  Yes    Education Details  Edu and purpose of mechanical traction, verbally reviewed HEP    Person(s) Educated  Patient    Methods  Explanation    Comprehension  Verbalized understanding       PT Short Term Goals - 07/03/17 1230      PT SHORT TERM GOAL #1   Title  Pt will be independent in her HEP and progression.     Time  4    Period  Weeks    Status  New    Target Date  07/31/17      PT SHORT TERM GOAL #2   Title  Pt will be able to report pain </= 4/10 with ADL's.     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     Time  8    Period  Weeks    Status  On-going      PT LONG TERM GOAL #2   Title  Pt will be able tolerate sitting for >/= 30 minutes with pain </= 2/10.     Time  8    Period  Weeks    Status  New       PT LONG TERM GOAL #3   Title  Pt will improve R LE hip strength to 5/5 in order to improve functional mobilty and gait.     Time  8    Period  Weeks    Status  New             Plan - 07/08/17 1120    Clinical Impression Statement  Pt tolerating therapy well and reporting relief following last PT session in her R leg, but reporting her left LE was more painful. Pt reported doing well with her HEP and feels like she was finally able to sleep a full night. Pt placed on mechanical traction and reported relief at end of session. Continue with skilled PT progressing toward pt's goals set.     Clinical Presentation  Evolving    Clinical Presentation due to:  Pain radiating down R LE into hip    Clinical Decision Making  Low    Rehab Potential  Excellent    PT Frequency  2x / week    PT Duration  8 weeks    PT Treatment/Interventions  Electrical Stimulation;Cryotherapy;Moist Heat;Traction;Ultrasound;Gait training;Stair training;Functional mobility training;Therapeutic activities;Therapeutic exercise;Manual techniques;Taping;Dry needling;Passive range of motion;Patient/family education    PT Next Visit Plan  Mechanical traction, Lumbar stretching and strengthening, Hip stretching/strenghtening    PT Home Exercise Plan  bridges, trunk rotation in hook lying, piriformis stretch, SLR    Consulted and Agree with Plan of Care  Patient       Patient will benefit from skilled therapeutic intervention in order to improve the following deficits and impairments:  Pain, Decreased activity tolerance, Difficulty walking, Decreased mobility, Decreased strength  Visit Diagnosis: Acute right-sided low back pain with right-sided sciatica - Plan: PT plan of care cert/re-cert  Muscle weakness (  generalized) - Plan: PT plan of care cert/re-cert     Problem List Patient Active Problem List   Diagnosis Date Noted  . Hemoperitoneum 08/04/2015  . Abdominal pain in female 08/04/2015  . S/P colonoscopy  08/04/2015  . GERD (gastroesophageal reflux disease)   . Gastroesophageal reflux disease with esophagitis     Sharmon LeydenJennifer R Jamariah Tony, MPT 07/08/2017, 12:01 PM  [facility] [address] [place], [place], [address] Phone: [phone]   Fax:  [phone]  Name: Marcia DryerHerita R Walker MRN: [id number] Date of Birth: 02-Jan-1956

## 2017-07-08 ENCOUNTER — Ambulatory Visit: Payer: BLUE CROSS/BLUE SHIELD | Admitting: Physical Therapy

## 2017-07-08 ENCOUNTER — Encounter: Payer: Self-pay | Admitting: Physical Therapy

## 2017-07-08 DIAGNOSIS — M5441 Lumbago with sciatica, right side: Secondary | ICD-10-CM | POA: Diagnosis not present

## 2017-07-08 DIAGNOSIS — M6281 Muscle weakness (generalized): Secondary | ICD-10-CM

## 2017-07-08 NOTE — Therapy (Signed)
[facility] [address] [place], [place], [address] Phone: [phone]   Fax:  [phone]  Physical Therapy Treatment  Patient Details  Name: Marcia Walker MRN: [id number] Date of Birth: 07-28-1955 Referring Provider: Norlene Campbell, MD   Encounter Date: 07/08/2017  PT End of Session - 07/08/17 0903    Visit Number  2    Number of Visits  16    Date for PT Re-Evaluation  09/02/17    PT Start Time  0848    PT Stop Time  0935    PT Time Calculation (min)  47 min    Activity Tolerance  Patient tolerated treatment well    Behavior During Therapy  [facility] for tasks assessed/performed       Past Medical History:  Diagnosis Date  . Felon of finger of right hand with lymphangitis 12/17/2011  . GERD (gastroesophageal reflux disease)   . Headache(784.0)    with allergies    Past Surgical History:  Procedure Laterality Date  . I&D EXTREMITY  12/17/2011   Procedure: IRRIGATION AND DEBRIDEMENT EXTREMITY;  Surgeon: Marlowe Shores, MD;  Location: [facility] OR;  Service: Orthopedics;  Laterality: Right;  Right Ring Finger  . left shouler manipulation  01/2010  . OTHER SURGICAL HISTORY  15 yrs ago   cyst removed from end of spine  . right shoulder manipulation 03/2011  03/2011  . UTERINE FIBROID SURGERY  2007    There were no vitals filed for this visit.  Subjective Assessment - 07/08/17 0855    Subjective  Pt arriving to therapy reporting 7/10 pain with sitting. Pt reporting that after her last visit her R leg was much better but her left leg began hurting. Pt reporting after sitting in the lobby and driving here her pain is worse.     Pertinent History  L3-4 lateral foraminal protrusion, L3-4 nerve root irritation    Patient Stated Goals  Stop hurting, be able to work without pain    Currently in Pain?  Yes    Pain Score  7     Pain Location  Back    Pain Orientation  Right    Pain Descriptors / Indicators  Aching    Pain Type   Acute pain    Pain Onset  More than a month ago    Pain Frequency  Constant                       [facility] Adult PT Treatment/Exercise - 07/08/17 0001      Lumbar Exercises: Stretches   Single Knee to Chest Stretch  Right;2 reps;10 seconds    Piriformis Stretch  Right;3 reps;10 seconds    Figure 4 Stretch  2 reps;10 seconds      Lumbar Exercises: Aerobic   Nustep  L4 x 5 minutes      Lumbar Exercises: Supine   Clam  10 reps;5 seconds;Limitations    Clam Limitations  green theraband    Bridge  10 reps;5 seconds;Limitations    Bridge Limitations  arms reaching toward ceiling    Straight Leg Raise  5 reps;3 seconds;Limitations    Straight Leg Raises Limitations  2 sets 2# ankle weight      Modalities   Modalities  Traction      Traction   Type of Traction  Lumbar    Min (lbs)  20    Max (lbs)  50    Time  17 minutes  PT Education - 07/08/17 [phone]    Education provided  Yes    Education Details  Edu and purpose of mechanical traction, verbally reviewed HEP    Person(s) Educated  Patient    Methods  Explanation    Comprehension  Verbalized understanding       PT Short Term Goals - 07/03/17 1230      PT SHORT TERM GOAL #1   Title  Pt will be independent in her HEP and progression.     Time  4    Period  Weeks    Status  New    Target Date  07/31/17      PT SHORT TERM GOAL #2   Title  Pt will be able to report pain </= 4/10 with ADL's.     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     Time  8    Period  Weeks    Status  On-going      PT LONG TERM GOAL #2   Title  Pt will be able tolerate sitting for >/= 30 minutes with pain </= 2/10.     Time  8    Period  Weeks    Status  New      PT LONG TERM GOAL #3   Title  Pt will improve R LE hip strength to 5/5  in order to improve functional mobilty and gait.     Time  8    Period  Weeks    Status  New            Plan - 07/08/17 1120    Clinical Impression Statement  Pt tolerating therapy well and reporting relief following last PT session in her R leg, but reporting her left LE was more painful. Pt reported doing well with her HEP and feels like she was finally able to sleep a full night. Pt placed on mechanical traction and reported relief at end of session. Continue with skilled PT progressing toward pt's goals set.     Clinical Presentation  Evolving    Clinical Presentation due to:  Pain radiating down R LE into hip    Clinical Decision Making  Low    Rehab Potential  Excellent    PT Frequency  2x / week    PT Duration  8 weeks    PT Treatment/Interventions  Electrical Stimulation;Cryotherapy;Moist Heat;Traction;Ultrasound;Gait training;Stair training;Functional mobility training;Therapeutic activities;Therapeutic exercise;Manual techniques;Taping;Dry needling;Passive range of motion;Patient/family education    PT Next Visit Plan  Mechanical traction, Lumbar stretching and strengthening, Hip stretching/strenghtening    PT Home Exercise Plan  bridges, trunk rotation in hook lying, piriformis stretch, SLR    Consulted and Agree with Plan of Care  Patient       Patient will benefit from skilled therapeutic intervention in order to improve the following deficits and impairments:  Pain, Decreased activity tolerance, Difficulty walking, Decreased mobility, Decreased strength  Visit Diagnosis: Acute right-sided low back pain with right-sided sciatica  Muscle weakness (generalized)     Problem List Patient Active Problem List   Diagnosis Date Noted  . Hemoperitoneum 08/04/2015  . Abdominal pain in female 08/04/2015  . S/P  colonoscopy 08/04/2015  . GERD (gastroesophageal reflux disease)   . Gastroesophageal reflux disease with esophagitis     Sharmon LeydenJennifer R Martin , MPT 07/08/2017,  11:24 AM  [facility] [address] [place], [place], [address] Phone: [phone]   Fax:  [phone]  Name: Marcia Walker MRN: [id number] Date of Birth: 1956/03/20

## 2017-07-10 ENCOUNTER — Ambulatory Visit: Payer: BLUE CROSS/BLUE SHIELD | Admitting: Physical Therapy

## 2017-07-10 ENCOUNTER — Encounter: Payer: Self-pay | Admitting: Physical Therapy

## 2017-07-10 DIAGNOSIS — M6281 Muscle weakness (generalized): Secondary | ICD-10-CM

## 2017-07-10 DIAGNOSIS — M5441 Lumbago with sciatica, right side: Secondary | ICD-10-CM | POA: Diagnosis not present

## 2017-07-10 NOTE — Therapy (Addendum)
[facility] [address] [place], [place], [address] Phone: [phone]   Fax:  [phone]  Physical Therapy Treatment  Patient Details  Name: Marcia DryerHerita R Canion MRN: [id number] Date of Birth: 1955-06-10 Referring Provider: Norlene CampbellPeter Whitfield, MD   Encounter Date: 07/10/2017  PT End of Session - 07/15/17 0853    Visit Number  4    Number of Visits  16    Date for PT Re-Evaluation  09/02/17    PT Start Time  0845    PT Stop Time  0940    PT Time Calculation (min)  55 min       Past Medical History:  Diagnosis Date  . Felon of finger of right hand with lymphangitis 12/17/2011  . GERD (gastroesophageal reflux disease)   . Headache(784.0)    with allergies    Past Surgical History:  Procedure Laterality Date  . I&D EXTREMITY  12/17/2011   Procedure: IRRIGATION AND DEBRIDEMENT EXTREMITY;  Surgeon: Marlowe ShoresMatthew A Weingold, MD;  Location: [facility] OR;  Service: Orthopedics;  Laterality: Right;  Right Ring Finger  . left shouler manipulation  01/2010  . OTHER SURGICAL HISTORY  15 yrs ago   cyst removed from end of spine  . right shoulder manipulation 03/2011  03/2011  . UTERINE FIBROID SURGERY  2007    There were no vitals filed for this visit.  Subjective Assessment - 07/15/17 0848    Currently in Pain?  Yes    Pain Score  6     Pain Location  Back    Pain Orientation  Right    Pain Descriptors / Indicators  Aching    Pain Radiating Towards  right hip, right toe    Aggravating Factors   driving                       [facility] Adult PT Treatment/Exercise - 07/15/17 0001      Self-Care   Self-Care  Other Self-Care Comments    Other Self-Care Comments   Tennis ball issued for self trigger point release and soft tissue work.       Lumbar Exercises: Stretches   Piriformis Stretch  Right;3 reps;10 seconds    Figure 4 Stretch  2 reps;10 seconds      Lumbar Exercises: Seated   Other Seated Lumbar Exercises  Seated pelvic  tilit, posture for driving.       Lumbar Exercises: Supine   Clam  20 reps;5 seconds;Limitations    Clam Limitations  green theraband, abilateral and unilateral     Bridge  10 reps      Traction   Min (lbs)  30    Max (lbs)  60    Hold Time  60    Rest Time  10    Time  15      Manual Therapy   Manual Therapy  Soft tissue mobilization    Soft tissue mobilization  in side lying, right glut med/max, trigger point release. Light to moderate pressure only.              PT Education - 07/15/17 [phone]    Education provided  Yes    Education Details  HEP    Person(s) Educated  Patient    Methods  Explanation;Handout    Comprehension  Verbalized understanding       PT Short Term Goals - 07/10/17 1222      PT SHORT TERM GOAL #1   Title  Pt will be independent in her HEP and progression.     Status  On-going      PT SHORT TERM GOAL #2   Title  Pt will be able to report pain </= 4/10 with ADL's.     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     Time  8    Period  Weeks    Status  On-going      PT LONG TERM GOAL #2   Title  Pt will be able tolerate sitting for >/= 30 minutes with pain </= 2/10.     Time  8    Period  Weeks    Status  New      PT LONG TERM GOAL #3   Title  Pt will improve R LE hip strength to 5/5 in order to improve functional mobilty and gait.     Time  8    Period  Weeks    Status  New            Plan - 07/15/17 0956    Clinical Impression Statement  Pt arrives reporting increased pain. Reviewed HEP and issued tennis ball for self triggerpoint release of pirifomis/ gluteals. Repeated traction with extra care taken to provide neck comfort. No increased pain after traction. Some initial anterior hip pulling that subsided.     PT Next Visit Plan  Mechanical traction, Lumbar stretching and strengthening, Hip  stretching/strenghtening    PT Home Exercise Plan  bridges, trunk rotation in hook lying, piriformis stretch, SLR, green band clam     Consulted and Agree with Plan of Care  Patient       Patient will benefit from skilled therapeutic intervention in order to improve the following deficits and impairments:  Pain, Decreased activity tolerance, Difficulty walking, Decreased mobility, Decreased strength  Visit Diagnosis: Acute right-sided low back pain with right-sided sciatica  Muscle weakness (generalized)     Problem List Patient Active Problem List   Diagnosis Date Noted  . Hemoperitoneum 08/04/2015  . Abdominal pain in female 08/04/2015  . S/P colonoscopy 08/04/2015  . GERD (gastroesophageal reflux disease)   . Gastroesophageal reflux disease with esophagitis     [name], MPT 07/15/2017, 11:39 AM  [facility] [address] [place], [place], [address] Phone: [phone]   Fax:  [phone]  Name: Marcia Walker MRN: [id number] Date of Birth: 1955-11-16

## 2017-07-15 ENCOUNTER — Encounter: Payer: Self-pay | Admitting: Physical Therapy

## 2017-07-15 ENCOUNTER — Ambulatory Visit (INDEPENDENT_AMBULATORY_CARE_PROVIDER_SITE_OTHER): Payer: BLUE CROSS/BLUE SHIELD | Admitting: Orthopaedic Surgery

## 2017-07-15 ENCOUNTER — Ambulatory Visit: Payer: BLUE CROSS/BLUE SHIELD | Admitting: Physical Therapy

## 2017-07-15 DIAGNOSIS — M5441 Lumbago with sciatica, right side: Secondary | ICD-10-CM | POA: Diagnosis not present

## 2017-07-15 DIAGNOSIS — M6281 Muscle weakness (generalized): Secondary | ICD-10-CM

## 2017-07-15 NOTE — Therapy (Signed)
[facility] [address] [place], [place], [address] Phone: [phone]   Fax:  [phone]  Physical Therapy Treatment  Patient Details  Name: Marcia Walker MRN: [id number] Date of Birth: Oct 19, 1955 Referring Provider: Norlene CampbellPeter Whitfield, MD   Encounter Date: 07/15/2017  PT End of Session - 07/15/17 0853    Visit Number  4    Number of Visits  16    Date for PT Re-Evaluation  09/02/17    PT Start Time  0845    PT Stop Time  0940    PT Time Calculation (min)  55 min       Past Medical History:  Diagnosis Date  . Felon of finger of right hand with lymphangitis 12/17/2011  . GERD (gastroesophageal reflux disease)   . Headache(784.0)    with allergies    Past Surgical History:  Procedure Laterality Date  . I&D EXTREMITY  12/17/2011   Procedure: IRRIGATION AND DEBRIDEMENT EXTREMITY;  Surgeon: Marlowe ShoresMatthew A Weingold, MD;  Location: [facility] OR;  Service: Orthopedics;  Laterality: Right;  Right Ring Finger  . left shouler manipulation  01/2010  . OTHER SURGICAL HISTORY  15 yrs ago   cyst removed from end of spine  . right shoulder manipulation 03/2011  03/2011  . UTERINE FIBROID SURGERY  2007    There were no vitals filed for this visit.  Subjective Assessment - 07/15/17 0848    Currently in Pain?  Yes    Pain Score  6     Pain Location  Back    Pain Orientation  Right    Pain Descriptors / Indicators  Aching    Pain Radiating Towards  right hip, right toe    Aggravating Factors   driving                       [facility] Adult PT Treatment/Exercise - 07/15/17 0001      Self-Care   Self-Care  Other Self-Care Comments    Other Self-Care Comments   Tennis ball issued for self trigger point release and soft tissue work.       Lumbar Exercises: Stretches   Piriformis Stretch  Right;3 reps;10 seconds    Figure 4 Stretch  2 reps;10 seconds      Lumbar Exercises: Seated   Other Seated Lumbar Exercises  Seated pelvic  tilit, posture for driving.       Lumbar Exercises: Supine   Clam  20 reps;5 seconds;Limitations    Clam Limitations  green theraband, abilateral and unilateral     Bridge  10 reps      Traction   Min (lbs)  30    Max (lbs)  60    Hold Time  60    Rest Time  10    Time  15      Manual Therapy   Manual Therapy  Soft tissue mobilization    Soft tissue mobilization  in side lying, right glut med/max, trigger point release. Light to moderate pressure only.              PT Education - 07/15/17 [phone]    Education provided  Yes    Education Details  HEP    Person(s) Educated  Patient    Methods  Explanation;Handout    Comprehension  Verbalized understanding       PT Short Term Goals - 07/10/17 1222      PT SHORT TERM GOAL #1   Title  Pt will be independent in her HEP and progression.     Status  On-going      PT SHORT TERM GOAL #2   Title  Pt will be able to report pain </= 4/10 with ADL's.     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     Time  8    Period  Weeks    Status  On-going      PT LONG TERM GOAL #2   Title  Pt will be able tolerate sitting for >/= 30 minutes with pain </= 2/10.     Time  8    Period  Weeks    Status  New      PT LONG TERM GOAL #3   Title  Pt will improve R LE hip strength to 5/5 in order to improve functional mobilty and gait.     Time  8    Period  Weeks    Status  New            Plan - 07/15/17 0956    Clinical Impression Statement  Pt arrives reporting increased pain. Reviewed HEP and issued tennis ball for self triggerpoint release of pirifomis/ gluteals. Repeated traction with extra care taken to provide neck comfort. No increased pain after traction. Some initial anterior hip pulling that subsided.     PT Next Visit Plan  Mechanical traction, Lumbar stretching and strengthening, Hip  stretching/strenghtening    PT Home Exercise Plan  bridges, trunk rotation in hook lying, piriformis stretch, SLR, green band clam     Consulted and Agree with Plan of Care  Patient       Patient will benefit from skilled therapeutic intervention in order to improve the following deficits and impairments:  Pain, Decreased activity tolerance, Difficulty walking, Decreased mobility, Decreased strength  Visit Diagnosis: Acute right-sided low back pain with right-sided sciatica  Muscle weakness (generalized)     Problem List Patient Active Problem List   Diagnosis Date Noted  . Hemoperitoneum 08/04/2015  . Abdominal pain in female 08/04/2015  . S/P colonoscopy 08/04/2015  . GERD (gastroesophageal reflux disease)   . Gastroesophageal reflux disease with esophagitis     Sherrie Mustache , PTA 07/15/2017, 10:04 AM  [facility] [address] [place], [place], [address] Phone: [phone]   Fax:  [phone]  Name: Marcia Walker MRN: [id number] Date of Birth: Feb 07, 1956

## 2017-07-17 ENCOUNTER — Encounter: Payer: Self-pay | Admitting: Physical Therapy

## 2017-07-17 ENCOUNTER — Ambulatory Visit: Payer: BLUE CROSS/BLUE SHIELD | Admitting: Physical Therapy

## 2017-07-17 DIAGNOSIS — M5441 Lumbago with sciatica, right side: Secondary | ICD-10-CM

## 2017-07-17 DIAGNOSIS — M6281 Muscle weakness (generalized): Secondary | ICD-10-CM

## 2017-07-17 NOTE — Therapy (Signed)
[facility] Outpatient Rehabilitation [facility] [address] [place], [place], [address] Phone: [phone]   Fax:  [phone]  Physical Therapy Treatment  Patient Details  Name: Marcia Walker MRN: [id number] Date of Birth: 1955-04-29 Referring Provider: Norlene Campbell, MD   Encounter Date: 07/17/2017  PT End of Session - 07/17/17 0927    Visit Number  5    Number of Visits  16    Date for PT Re-Evaluation  09/02/17    PT Start Time  0856    PT Stop Time  0938    PT Time Calculation (min)  42 min    Activity Tolerance  Patient tolerated treatment well    Behavior During Therapy  [facility] for tasks assessed/performed       Past Medical History:  Diagnosis Date  . Felon of finger of right hand with lymphangitis 12/17/2011  . GERD (gastroesophageal reflux disease)   . Headache(784.0)    with allergies    Past Surgical History:  Procedure Laterality Date  . I&D EXTREMITY  12/17/2011   Procedure: IRRIGATION AND DEBRIDEMENT EXTREMITY;  Surgeon: Marlowe Shores, MD;  Location: [facility] OR;  Service: Orthopedics;  Laterality: Right;  Right Ring Finger  . left shouler manipulation  01/2010  . OTHER SURGICAL HISTORY  15 yrs ago   cyst removed from end of spine  . right shoulder manipulation 03/2011  03/2011  . UTERINE FIBROID SURGERY  2007    There were no vitals filed for this visit.  Subjective Assessment - 07/17/17 0925    Subjective  Pt reporting feeling much better, still reporting stiffness in low back and mild pain at times in her piriformis area and right hip.    Pertinent History  L3-4 lateral foraminal protrusion, L3-4 nerve root irritation    Limitations  Sitting;House hold activities;Lifting    Patient Stated Goals  Stop hurting, be able to work without pain    Pain Score  4     Pain Location  Back    Pain Orientation  Right    Pain Descriptors / Indicators  Aching    Pain Type  Acute pain    Pain Onset  More than a month ago    Pain Frequency   Intermittent                       [facility] Adult PT Treatment/Exercise - 07/17/17 0001      Lumbar Exercises: Stretches   Piriformis Stretch  Right;3 reps;10 seconds    Figure 4 Stretch  3 reps;20 seconds      Lumbar Exercises: Seated   Other Seated Lumbar Exercises  Seated pelvic tilit, posture for driving. standing pelvic tilts against wall for biofeedback      Lumbar Exercises: Supine   Clam  20 reps;5 seconds;Limitations    Clam Limitations  green theraband, abilateral and unilateral       Lumbar Exercises: Sidelying   Clam  Right;10 reps;3 seconds;Limitations    Clam Limitations  green theraband      Modalities   Modalities  Moist Heat;Traction      Moist Heat Therapy   Number Minutes Moist Heat  8 Minutes    Moist Heat Location  Lumbar Spine      Traction   Type of Traction  Lumbar    Min (lbs)  30    Max (lbs)  60    Hold Time  60    Rest Time  10  Time  17      Manual Therapy   Manual Therapy  Soft tissue mobilization    Soft tissue mobilization  in sidelying, piriformis lumbar parspinals             PT Education - 07/17/17 0926    Education provided  Yes    Education Details  seated and standing pelvic tilts, standing posture and lifting techniques    Person(s) Educated  Patient    Methods  Explanation;Demonstration    Comprehension  Verbalized understanding;Returned demonstration       PT Short Term Goals - 07/10/17 1222      PT SHORT TERM GOAL #1   Title  Pt will be independent in her HEP and progression.     Status  On-going      PT SHORT TERM GOAL #2   Title  Pt will be able to report pain </= 4/10 with ADL's.     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     Time  8    Status  On-going      PT LONG TERM GOAL #2   Time  8    Period  Weeks    Status  New      PT LONG TERM GOAL #3   Title  Pt  will improve R LE hip strength to 5/5 in order to improve functional mobilty and gait.     Time  8    Period  Weeks    Status  New            Plan - 07/17/17 1610    Clinical Impression Statement  Pt arriving to therapy reporting progress since beginning therapy and traction. Pt still having intermittent pain in piriformis and R hip. Pt able to exerecise and perform STW/trigger point release using a tennis ball to help relieve some pain. Pt was edu in lifting and exercises techniques and sitting and standing PPT. Continue skilled PT to progress toward goals set.     Clinical Presentation  Stable    Rehab Potential  Excellent    PT Frequency  2x / week    PT Duration  8 weeks    PT Treatment/Interventions  Electrical Stimulation;Cryotherapy;Moist Heat;Traction;Ultrasound;Gait training;Stair training;Functional mobility training;Therapeutic activities;Therapeutic exercise;Manual techniques;Taping;Dry needling;Passive range of motion;Patient/family education    PT Next Visit Plan  Mechanical traction, Lumbar stretching and strengthening, Hip stretching/strenghtening    PT Home Exercise Plan  bridges, trunk rotation in hook lying, piriformis stretch, SLR, green band clam     Consulted and Agree with Plan of Care  Patient       Patient will benefit from skilled therapeutic intervention in order to improve the following deficits and impairments:  Pain, Decreased activity tolerance, Difficulty walking, Decreased mobility, Decreased strength  Visit Diagnosis: Acute right-sided low back pain with right-sided sciatica  Muscle weakness (generalized)     Problem List Patient Active Problem List   Diagnosis Date Noted  . Hemoperitoneum 08/04/2015  . Abdominal pain in female 08/04/2015  . S/P colonoscopy 08/04/2015  . GERD (gastroesophageal reflux disease)   . Gastroesophageal reflux disease with esophagitis     [name], MPT 07/17/2017, 9:31 AM  [facility] [address] [place], [place], [address]  Phone: [phone]   Fax:  [phone]  Name: Marcia Walker MRN: [id number] Date of Birth: 1956/01/16

## 2017-07-23 ENCOUNTER — Ambulatory Visit: Payer: BLUE CROSS/BLUE SHIELD | Admitting: Physical Therapy

## 2017-07-26 ENCOUNTER — Encounter

## 2017-07-26 ENCOUNTER — Ambulatory Visit: Payer: BLUE CROSS/BLUE SHIELD | Admitting: Physical Therapy

## 2017-07-30 ENCOUNTER — Ambulatory Visit: Payer: BLUE CROSS/BLUE SHIELD | Admitting: Physical Therapy

## 2017-07-30 ENCOUNTER — Encounter: Payer: Self-pay | Admitting: Physical Therapy

## 2017-07-30 DIAGNOSIS — M5441 Lumbago with sciatica, right side: Secondary | ICD-10-CM

## 2017-07-30 DIAGNOSIS — M6281 Muscle weakness (generalized): Secondary | ICD-10-CM

## 2017-07-30 NOTE — Therapy (Signed)
[facility] Outpatient Rehabilitation [facility] [address] [place], [place], [address] Phone: [phone]   Fax:  [phone]  Physical Therapy Treatment  Patient Details  Name: Marcia Walker MRN: [id number] Date of Birth: [birth year] Referring Provider: Norlene Campbell, MD   Encounter Date: 07/30/2017  PT End of Session - 07/30/17 1607    Visit Number  6    Number of Visits  16    Date for PT Re-Evaluation  09/02/17    PT Start Time  0400 15 minutes late    PT Stop Time  0440    PT Time Calculation (min)  40 min       Past Medical History:  Diagnosis Date  . Felon of finger of right hand with lymphangitis 12/17/2011  . GERD (gastroesophageal reflux disease)   . Headache(784.0)    with allergies    Past Surgical History:  Procedure Laterality Date  . I&D EXTREMITY  12/17/2011   Procedure: IRRIGATION AND DEBRIDEMENT EXTREMITY;  Surgeon: Marlowe Shores, MD;  Location: [facility] OR;  Service: Orthopedics;  Laterality: Right;  Right Ring Finger  . left shouler manipulation  01/2010  . OTHER SURGICAL HISTORY  15 yrs ago   cyst removed from end of spine  . right shoulder manipulation 03/2011  03/2011  . UTERINE FIBROID SURGERY  2007    There were no vitals filed for this visit.  Subjective Assessment - 07/30/17 1601    Subjective  Friday and saturday worked 14 hours straight each day. And was hurting. Used the back brace.     Currently in Pain?  No/denies    Aggravating Factors   driving, prolonged working          [facility] PT Assessment - 07/30/17 0001      Strength   Right Hip Flexion  4/5    Right Hip Extension  4/5    Right Hip External Rotation   4/5    Right Hip ADduction  4/5                   [facility] Adult PT Treatment/Exercise - 07/30/17 0001      Lumbar Exercises: Stretches   Piriformis Stretch  Right;3 reps;10 seconds    Figure 4 Stretch  3 reps;20 seconds      Lumbar Exercises: Supine   Bridge  10 reps    Bridge with  clamshell  -- 6 reps green band, increased pain so disc.     Straight Leg Raise  10 reps each      Lumbar Exercises: Sidelying   Clam  Right;10 reps;3 seconds;Limitations    Hip Abduction  10 reps      Lumbar Exercises: Prone   Straight Leg Raise  10 reps BOTH       Traction   Type of Traction  Lumbar    Min (lbs)  30    Max (lbs)  60    Hold Time  60    Rest Time  10    Time  17             PT Education - 07/30/17 1634    Education provided  Yes    Education Details  HEP- stop if painful     Person(s) Educated  Patient    Methods  Explanation;Handout    Comprehension  Verbalized understanding       PT Short Term Goals - 07/30/17 1605      PT SHORT TERM GOAL #1  Title  Pt will be independent in her HEP and progression.     Time  4    Period  Weeks    Status  Achieved      PT SHORT TERM GOAL #2   Title  Pt will be able to report pain </= 4/10 with ADL's.     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     Time  8    Period  Weeks    Status  Unable to assess      PT LONG TERM GOAL #2   Title  Pt will be able tolerate sitting for >/= 30 minutes with pain </= 2/10.     Baseline  1 hour , 45 minutes without increased pain.     Time  8    Period  Weeks    Status  Achieved            Plan - 07/30/17 1602    Clinical Impression Statement  Pt reported marked improvement in overall pain. Sleep improved, able to sleep sidelying and on back without as much pain. Able to drive 1.5 hours without increased pain.  She no longer feels "a rock" in her gluteals when sitting. She reports Average pain reduced from 8/10 to 4/10. Her hip strength has improved from 4-/5 to grossly 4/5. Advanced hip stabilization with mild increased in right buttock pain. Cautioned pt to not over do it with exercise if  she feels increased pain. Repeated lumbar traction per pt request.     PT Next Visit Plan  Mechanical traction, Lumbar stretching and strengthening, Hip stretching/strenghtening    PT Home Exercise Plan  bridges, trunk rotation in hook lying, piriformis stretch, SLR, green band clam , supine bridge with green band clam     Consulted and Agree with Plan of Care  Patient       Patient will benefit from skilled therapeutic intervention in order to improve the following deficits and impairments:  Pain, Decreased activity tolerance, Difficulty walking, Decreased mobility, Decreased strength  Visit Diagnosis: Acute right-sided low back pain with right-sided sciatica  Muscle weakness (generalized)     Problem List Patient Active Problem List   Diagnosis Date Noted  . Hemoperitoneum 08/04/2015  . Abdominal pain in female 08/04/2015  . S/P colonoscopy 08/04/2015  . GERD (gastroesophageal reflux disease)   . Gastroesophageal reflux disease with esophagitis     Sherrie Mustache, PTA 07/30/2017, 5:22 PM  [facility] [address] [place], [place], [address] Phone: [phone]   Fax:  [phone]  Name: Marcia Walker MRN: [id number] Date of Birth: 08-29-1955

## 2017-08-01 ENCOUNTER — Encounter: Payer: Self-pay | Admitting: Physical Therapy

## 2017-08-01 ENCOUNTER — Ambulatory Visit: Payer: BLUE CROSS/BLUE SHIELD | Attending: Orthopaedic Surgery | Admitting: Physical Therapy

## 2017-08-01 DIAGNOSIS — M6281 Muscle weakness (generalized): Secondary | ICD-10-CM | POA: Insufficient documentation

## 2017-08-01 DIAGNOSIS — M5441 Lumbago with sciatica, right side: Secondary | ICD-10-CM

## 2017-08-01 NOTE — Therapy (Signed)
[facility] Outpatient Rehabilitation [facility] [address] [place], [place], [address] Phone: [phone]   Fax:  [phone]  Physical Therapy Treatment  Patient Details  Name: Marcia Walker MRN: [id number] Date of Birth: 12-Mar-1956 Referring Provider: Norlene Campbell, MD   Encounter Date: 08/01/2017  PT End of Session - 08/01/17 0827    Visit Number  7    Number of Visits  16    Date for PT Re-Evaluation  09/02/17    PT Start Time  0812    PT Stop Time  0917    PT Time Calculation (min)  65 min    Activity Tolerance  Patient tolerated treatment well    Behavior During Therapy  [facility] for tasks assessed/performed       Past Medical History:  Diagnosis Date  . Felon of finger of right hand with lymphangitis 12/17/2011  . GERD (gastroesophageal reflux disease)   . Headache(784.0)    with allergies    Past Surgical History:  Procedure Laterality Date  . I&D EXTREMITY  12/17/2011   Procedure: IRRIGATION AND DEBRIDEMENT EXTREMITY;  Surgeon: Marlowe Shores, MD;  Location: [facility] OR;  Service: Orthopedics;  Laterality: Right;  Right Ring Finger  . left shouler manipulation  01/2010  . OTHER SURGICAL HISTORY  15 yrs ago   cyst removed from end of spine  . right shoulder manipulation 03/2011  03/2011  . UTERINE FIBROID SURGERY  2007    There were no vitals filed for this visit.  Subjective Assessment - 08/01/17 0814    Subjective  Patient reports her pain today is about a 4-5/10. She has been having some pain when she does her front leg lifts. She did her exercises already this morning. The patient was 9 minutes late to her appointment.     Pertinent History  L3-4 lateral foraminal protrusion, L3-4 nerve root irritation    Limitations  Sitting;House hold activities;Lifting    How long can you sit comfortably?  5-10 minutes    How long can you stand comfortably?  unlimited    How long can you walk comfortably?  unlimited    Patient Stated Goals  Stop hurting,  be able to work without pain    Currently in Pain?  Yes    Pain Score  4     Pain Location  Back    Pain Orientation  Right    Pain Descriptors / Indicators  Aching    Pain Type  Acute pain    Pain Radiating Towards  right hip and right toe     Pain Onset  More than a month ago    Pain Frequency  Intermittent    Aggravating Factors   driving, prolonged working     Pain Relieving Factors  down to right ankle     Effect of Pain on Daily Activities  difficulty sleeping, working and sitting                        [facility] Adult PT Treatment/Exercise - 08/01/17 0001      Lumbar Exercises: Stretches   Active Hamstring Stretch  3 reps;30 seconds    Piriformis Stretch  Right;3 reps;10 seconds    Figure 4 Stretch  3 reps;20 seconds    Other Lumbar Stretch Exercise  prayer stretch/ Lateral prayer stretch 3x20 sec each;       Lumbar Exercises: Standing   Other Standing Lumbar Exercises  3 way hip 2x10  Lumbar Exercises: Supine   Bridge  10 reps      Lumbar Exercises: Sidelying   Clam  10 reps    Clam Limitations  green TB      Traction   Type of Traction  Lumbar    Min (lbs)  30    Max (lbs)  60    Hold Time  60    Rest Time  10    Time  17      Manual Therapy   Manual Therapy  Soft tissue mobilization    Manual therapy comments  LAD in supine     Soft tissue mobilization  in sidelying, piriformis lumbar parspinals using IASYM;              PT Education - 08/01/17 1610    Education provided  Yes    Education Details  reviewed new exercises     Person(s) Educated  Patient    Methods  Explanation;Demonstration;Tactile cues;Verbal cues    Comprehension  Verbalized understanding;Verbal cues required;Returned demonstration;Tactile cues required       PT Short Term Goals - 07/30/17 1605      PT SHORT TERM GOAL #1   Title  Pt will be independent in her HEP and progression.     Time  4    Period  Weeks    Status  Achieved      PT SHORT TERM GOAL  #2   Title  Pt will be able to report pain </= 4/10 with ADL's.     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     Time  8    Period  Weeks    Status  Unable to assess      PT LONG TERM GOAL #2   Title  Pt will be able tolerate sitting for >/= 30 minutes with pain </= 2/10.     Baseline  1 hour , 45 minutes without increased pain.     Time  8    Period  Weeks    Status  Achieved            Plan - 08/01/17 9604    Clinical Impression Statement  Patient tolerated treatment well. The area were she felt twinging when she came in was gone after treatment. She tolerated exercises wel. Therapy added in a prayer stretch for home. She reported a good stretch with prayer stretch. Therapy also reviewed standing exercises 2nd to  just having done her other exercises priopr to coming into therapy.     Clinical Presentation  Stable    Clinical Decision Making  Low    Rehab Potential  Excellent    PT Frequency  2x / week    PT Duration  8 weeks    PT Treatment/Interventions  Electrical Stimulation;Cryotherapy;Moist Heat;Traction;Ultrasound;Gait training;Stair training;Functional mobility training;Therapeutic activities;Therapeutic exercise;Manual techniques;Taping;Dry needling;Passive range of motion;Patient/family education    PT Next Visit Plan  Mechanical traction, Lumbar stretching and strengthening, Hip stretching/strenghtening    PT Home Exercise Plan  bridges, trunk rotation in hook lying, piriformis stretch, SLR, green band clam , supine bridge with green band clam     Consulted and Agree with Plan of Care  Patient  Patient will benefit from skilled therapeutic intervention in order to improve the following deficits and impairments:  Pain, Decreased activity tolerance, Difficulty walking, Decreased mobility,  Decreased strength  Visit Diagnosis: Acute right-sided low back pain with right-sided sciatica  Muscle weakness (generalized)     Problem List Patient Active Problem List   Diagnosis Date Noted  . Hemoperitoneum 08/04/2015  . Abdominal pain in female 08/04/2015  . S/P colonoscopy 08/04/2015  . GERD (gastroesophageal reflux disease)   . Gastroesophageal reflux disease with esophagitis    Elisabeth Pigeon SPT  08/01/2017   Dessie Coma PT DPT  08/01/2017, 2:52 PM   During this treatment session, the therapist was present, participating in and directing the treatment.  [facility] Outpatient Rehabilitation [facility] [address] [place], [place], [address] Phone: [phone]   Fax:  [phone]  Name: Marcia Walker MRN: [id number] Date of Birth: Sep 14, 1955

## 2017-08-02 ENCOUNTER — Encounter

## 2017-08-05 ENCOUNTER — Ambulatory Visit (INDEPENDENT_AMBULATORY_CARE_PROVIDER_SITE_OTHER): Payer: BLUE CROSS/BLUE SHIELD | Admitting: Orthopaedic Surgery

## 2017-08-06 ENCOUNTER — Ambulatory Visit: Payer: BLUE CROSS/BLUE SHIELD | Admitting: Physical Therapy

## 2017-08-06 ENCOUNTER — Encounter: Payer: Self-pay | Admitting: Physical Therapy

## 2017-08-06 DIAGNOSIS — M6281 Muscle weakness (generalized): Secondary | ICD-10-CM

## 2017-08-06 DIAGNOSIS — M5441 Lumbago with sciatica, right side: Secondary | ICD-10-CM | POA: Diagnosis not present

## 2017-08-06 NOTE — Therapy (Signed)
[facility] Outpatient Rehabilitation [facility] [address] [place], [place], [address] Phone: [phone]   Fax:  [phone]  Physical Therapy Treatment  Patient Details  Name: Marcia Walker MRN: [id number] Date of Birth: January 25, 1956 Referring Provider: Norlene Campbell, MD   Encounter Date: 08/06/2017  PT End of Session - 08/06/17 0956    Visit Number  8    Number of Visits  16    Date for PT Re-Evaluation  09/02/17    PT Start Time  0855    PT Stop Time  0940    PT Time Calculation (min)  45 min    Activity Tolerance  Patient tolerated treatment well    Behavior During Therapy  [facility] for tasks assessed/performed       Past Medical History:  Diagnosis Date  . Felon of finger of right hand with lymphangitis 12/17/2011  . GERD (gastroesophageal reflux disease)   . Headache(784.0)    with allergies    Past Surgical History:  Procedure Laterality Date  . I&D EXTREMITY  12/17/2011   Procedure: IRRIGATION AND DEBRIDEMENT EXTREMITY;  Surgeon: Marlowe Shores, MD;  Location: [facility] OR;  Service: Orthopedics;  Laterality: Right;  Right Ring Finger  . left shouler manipulation  01/2010  . OTHER SURGICAL HISTORY  15 yrs ago   cyst removed from end of spine  . right shoulder manipulation 03/2011  03/2011  . UTERINE FIBROID SURGERY  2007    There were no vitals filed for this visit.  Subjective Assessment - 08/06/17 0953    Subjective  Pt arriving to therapy reporting 5/10 pain in her R hip/low back. Pt also reporting R sided neck pain, pointing to her upper trap. Pt arriving 10 minutes late to therapy session.     Pertinent History  L3-4 lateral foraminal protrusion, L3-4 nerve root irritation    Limitations  Sitting;House hold activities;Lifting    Patient Stated Goals  Stop hurting, be able to work without pain    Currently in Pain?  Yes    Pain Score  5     Pain Location  Back    Pain Orientation  Right    Pain Descriptors / Indicators  Aching    Pain Type   Acute pain    Pain Radiating Towards  right hip    Multiple Pain Sites  Yes    Pain Score  4    Pain Location  Neck    Pain Orientation  Right    Pain Descriptors / Indicators  Aching    Pain Type  Acute pain    Pain Onset  In the past 7 days    Pain Frequency  Intermittent    Aggravating Factors   lifting her case into and out of her car                       [facility] Adult PT Treatment/Exercise - 08/06/17 0001      Lumbar Exercises: Stretches   Active Hamstring Stretch  3 reps;30 seconds    ITB Stretch  Right;2 reps;30 seconds    Other Lumbar Stretch Exercise  Cat/Camel holding 10 seconds each    Other Lumbar Stretch Exercise  prayer stretch/ Lateral prayer stretch 3x20 sec each;       Traction   Type of Traction  Lumbar    Min (lbs)  30    Max (lbs)  60    Hold Time  60    Rest Time  10    Time  17             PT Education - 08/06/17 0956    Education provided  Yes    Education Details  Added cervical rotation,  upper trap stretch, cat/camel, and hip flexor stretch, reviewed prayer stretch    Person(s) Educated  Patient    Methods  Explanation;Handout;Verbal cues    Comprehension  Returned demonstration;Verbalized understanding       PT Short Term Goals - 07/30/17 1605      PT SHORT TERM GOAL #1   Title  Pt will be independent in her HEP and progression.     Time  4    Period  Weeks    Status  Achieved      PT SHORT TERM GOAL #2   Title  Pt will be able to report pain </= 4/10 with ADL's.     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     Time  8    Period  Weeks    Status  Unable to assess      PT LONG TERM GOAL #2   Title  Pt will be able tolerate sitting for >/= 30 minutes with pain </= 2/10.     Baseline  1 hour , 45 minutes without increased pain.     Period   Weeks    Status  Achieved      PT LONG TERM GOAL #3   Title  Pt will improve R LE hip strength to 5/5 in order to improve functional mobilty and gait.     Time  8    Period  Weeks    Status  On-going            Plan - 08/06/17 0957    Clinical Impression Statement  Patient tolerating treatment well. Pt reporting less pain following traction and stretches, handout was issued to pt for new HEP stretches. Pt with noted tight hip flexor compared to her left. Continue skilled PT as pt progresses to progress toward pt's PLOF.     Rehab Potential  Excellent    PT Frequency  2x / week    PT Duration  8 weeks    PT Treatment/Interventions  Electrical Stimulation;Cryotherapy;Moist Heat;Traction;Ultrasound;Gait training;Stair training;Functional mobility training;Therapeutic activities;Therapeutic exercise;Manual techniques;Taping;Dry needling;Passive range of motion;Patient/family education    PT Next Visit Plan  Mechanical traction, Lumbar stretching and strengthening, Hip stretching/strenghtening    PT Home Exercise Plan  bridges, trunk rotation in hook lying, piriformis stretch, SLR, green band clam , supine bridge with green band clam     Consulted and Agree with Plan of Care  Patient       Patient will benefit from skilled therapeutic intervention in order to improve the following deficits and impairments:  Pain, Decreased activity tolerance, Difficulty walking, Decreased mobility, Decreased strength  Visit Diagnosis: Acute right-sided low back pain with right-sided sciatica  Muscle weakness (generalized)     Problem List Patient Active Problem List   Diagnosis Date Noted  . Hemoperitoneum 08/04/2015  . Abdominal pain in female 08/04/2015  . S/P colonoscopy 08/04/2015  . GERD (gastroesophageal reflux disease)   . Gastroesophageal reflux disease with  esophagitis     [name], MPT 08/06/2017, 10:02 AM  [facility] [address] [place], [place], [address] Phone: [phone]   Fax:  [phone]  Name: Marcia Walker MRN: [id number] Date of Birth: 25-Aug-1955

## 2017-08-13 ENCOUNTER — Ambulatory Visit: Payer: BLUE CROSS/BLUE SHIELD | Admitting: Physical Therapy

## 2017-08-13 ENCOUNTER — Encounter: Payer: Self-pay | Admitting: Physical Therapy

## 2017-08-13 DIAGNOSIS — M5441 Lumbago with sciatica, right side: Secondary | ICD-10-CM

## 2017-08-13 DIAGNOSIS — M6281 Muscle weakness (generalized): Secondary | ICD-10-CM

## 2017-08-13 NOTE — Therapy (Signed)
[facility] Rehabilitation [facility] [address] [place], [place], [address] Phone: [phone]   Fax:  [phone]  Physical Therapy Treatment  Patient Details  Name: Marcia Walker MRN: [id number] Date of Birth: 1956/01/17 Referring Provider: Norlene Campbell, MD   Encounter Date: 08/13/2017  PT End of Session - 08/13/17 1548    Visit Number  9    Number of Visits  16    Date for PT Re-Evaluation  09/02/17    PT Start Time  0345    PT Stop Time  0430    PT Time Calculation (min)  45 min       Past Medical History:  Diagnosis Date  . Felon of finger of right hand with lymphangitis 12/17/2011  . GERD (gastroesophageal reflux disease)   . Headache(784.0)    with allergies    Past Surgical History:  Procedure Laterality Date  . I&D EXTREMITY  12/17/2011   Procedure: IRRIGATION AND DEBRIDEMENT EXTREMITY;  Surgeon: Marlowe Shores, MD;  Location: [facility] OR;  Service: Orthopedics;  Laterality: Right;  Right Ring Finger  . left shouler manipulation  01/2010  . OTHER SURGICAL HISTORY  15 yrs ago   cyst removed from end of spine  . right shoulder manipulation 03/2011  03/2011  . UTERINE FIBROID SURGERY  2007    There were no vitals filed for this visit.  Subjective Assessment - 08/13/17 1545    Subjective  Hip and back woke me up at 4:30 am. I am in pain. Tried meds and heat, still hurting. Long day at work yesterday.     Pertinent History  L3-4 lateral foraminal protrusion, L3-4 nerve root irritation    Currently in Pain?  Yes    Pain Score  9     Pain Location  Back    Pain Orientation  Right    Pain Descriptors / Indicators  Aching    Pain Radiating Towards  right hip    Aggravating Factors   work, unsure     Pain Score  9    Pain Location  Neck    Pain Orientation  Right    Pain Descriptors / Indicators  Aching    Aggravating Factors   unsure , work                        [facility] Adult PT Treatment/Exercise - 08/13/17 0001       Therapeutic Activites    Therapeutic Activities  Lifting    Lifting  Simulatin of lifting makeup case in and out of trunk using proper alignment instead of pivot to lower into trunk x 2       Lumbar Exercises: Stretches   Hip Flexor Stretch  1 rep;60 seconds    Prone on Elbows Stretch  60 seconds    Press Ups  5 seconds;5 reps      Modalities   Modalities  Geologist, engineering Location  Right lumbar/gluteal    Electrical Stimulation Action  IFC x 15 min    Electrical Stimulation Parameters  12ma    Electrical Stimulation Goals  Pain      Manual Therapy   Manual Therapy  Passive ROM    Soft tissue mobilization  massage roller to right thigh in stretch     Passive ROM  Passive hip flexion, IR, ER  PT Short Term Goals - 07/30/17 1605      PT SHORT TERM GOAL #1   Title  Pt will be independent in her HEP and progression.     Time  4    Period  Weeks    Status  Achieved      PT SHORT TERM GOAL #2   Title  Pt will be able to report pain </= 4/10 with ADL's.     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     Time  8    Period  Weeks    Status  Unable to assess      PT LONG TERM GOAL #2   Title  Pt will be able tolerate sitting for >/= 30 minutes with pain </= 2/10.     Baseline  1 hour , 45 minutes without increased pain.     Period  Weeks    Status  Achieved      PT LONG TERM GOAL #3   Title  Pt will improve R LE hip strength to 5/5 in order to improve functional mobilty and gait.     Time  8    Period  Weeks    Status  On-going            Plan - 08/13/17 1549    Clinical Impression Statement  Pt arrives reporting 9/10 right LBP that started yesterday and radiates to the top of her foot. 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 She tried various exercises without relief. PROM of right hip was WNL except hip flexor which we stretch and performed massage roller to right thigh. POE and prone pressups centralized her LBP and she felt better with less antalgic gait upon standing. We walked out to her car and inspected her luggage case which is about 40 to 50 pounds. SHe decribes a lift from a depp squat to chest height and then a left pivot and lower the case into trunk. I instructed her in the need to turn her entire body and squat as she lowers her case. She also should consider reducing the weight which she reports is impossible. Trial of IFC today which she reported significantly reduced her Lumbar/buttock pain.     PT Next Visit Plan  FOTO ; Mechanical traction, Lumbar stretching and strengthening, Hip stretching/strenghtening, simulate lowering makeup case into trunk, assesS IFC    PT Home Exercise Plan  bridges, trunk rotation in hook lying, piriformis stretch, SLR, green band clam , supine bridge with green band clam , cervical rotation snags    Consulted and Agree with Plan of Care  Patient       Patient will benefit from skilled therapeutic intervention in order to improve the following deficits and impairments:  Pain, Decreased activity tolerance, Difficulty walking, Decreased mobility, Decreased strength  Visit Diagnosis: Muscle weakness (generalized)  Acute right-sided low back pain with right-sided sciatica     Problem List Patient Active Problem List   Diagnosis Date Noted  . Hemoperitoneum 08/04/2015  . Abdominal pain in female 08/04/2015  .  S/P colonoscopy 08/04/2015  . GERD (gastroesophageal reflux disease)   . Gastroesophageal reflux disease with esophagitis     Sherrie Mustache, PTA 08/13/2017, 5:24 PM  [facility]  [facility] [address] [place], [place], [address] Phone: [phone]   Fax:  [phone]  Name: Marcia Walker MRN: [id number] Date of Birth: April 20, 1955

## 2017-08-14 ENCOUNTER — Encounter: Payer: Self-pay | Admitting: Physical Therapy

## 2017-08-14 ENCOUNTER — Ambulatory Visit: Payer: BLUE CROSS/BLUE SHIELD | Admitting: Physical Therapy

## 2017-08-14 DIAGNOSIS — M5441 Lumbago with sciatica, right side: Secondary | ICD-10-CM

## 2017-08-14 DIAGNOSIS — M6281 Muscle weakness (generalized): Secondary | ICD-10-CM

## 2017-08-14 NOTE — Therapy (Signed)
[facility] [address] [place], [place], [address] Phone: [phone]   Fax:  [phone]  Physical Therapy Treatment  Patient Details  Name: Marcia Walker MRN: [id number] Date of Birth: December 27, 1955 Referring Provider: Norlene Campbell, MD   Encounter Date: 08/14/2017  PT End of Session - 08/14/17 1200    Visit Number  10    Number of Visits  16    Date for PT Re-Evaluation  09/02/17    PT Start Time  1145    PT Stop Time  1300    PT Time Calculation (min)  75 min       Past Medical History:  Diagnosis Date  . Felon of finger of right hand with lymphangitis 12/17/2011  . GERD (gastroesophageal reflux disease)   . Headache(784.0)    with allergies    Past Surgical History:  Procedure Laterality Date  . I&D EXTREMITY  12/17/2011   Procedure: IRRIGATION AND DEBRIDEMENT EXTREMITY;  Surgeon: Marlowe Shores, MD;  Location: [facility] OR;  Service: Orthopedics;  Laterality: Right;  Right Ring Finger  . left shouler manipulation  01/2010  . OTHER SURGICAL HISTORY  15 yrs ago   cyst removed from end of spine  . right shoulder manipulation 03/2011  03/2011  . UTERINE FIBROID SURGERY  2007    There were no vitals filed for this visit.  Subjective Assessment - 08/14/17 1150    Subjective  I feel better. Stif in low back/tight. Pain 4/10- 5/10 today in low back.    Currently in Pain?  Yes    Pain Score  4     Pain Location  Back    Pain Orientation  Right;Left;Lower         [facility] PT Assessment - 08/14/17 0001      Observation/Other Assessments   Focus on Therapeutic Outcomes (FOTO)   40% limited improved from 58% limited                    [facility] Adult PT Treatment/Exercise - 08/14/17 0001      Self-Care   Other Self-Care Comments   Introduced LUMBAR DDS decompression brace- needs smaller size      Lumbar Exercises: Stretches   Prone on Elbows Stretch  60 seconds    Press Ups  5 seconds;5 reps    Other  Lumbar Stretch Exercise  prayer stretch/ Lateral prayer stretch 3x20 sec each;       Lumbar Exercises: Prone   Straight Leg Raise  10 reps BOTH       Lumbar Exercises: Quadruped   Madcat/Old Horse  10 reps      Moist Heat Therapy   Number Minutes Moist Heat  15 Minutes    Moist Heat Location  Lumbar Spine and right upper trap      Electrical Stimulation   Electrical Stimulation Location  Right lumbar/gluteal    Electrical Stimulation Action  IFC x 15 min    Electrical Stimulation Parameters  13 ma    Electrical Stimulation Goals  Pain      Traction   Min (lbs)  30    Max (lbs)  60    Hold Time  60    Rest Time  10    Time  15      Manual Therapy   Passive ROM  prone hip IR/ER PROM with active release to right piriformis , trigger point release x 1 to right upper trap  PT Short Term Goals - 07/30/17 1605      PT SHORT TERM GOAL #1   Title  Pt will be independent in her HEP and progression.     Time  4    Period  Weeks    Status  Achieved      PT SHORT TERM GOAL #2   Title  Pt will be able to report pain </= 4/10 with ADL's.     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     Time  8    Period  Weeks    Status  Unable to assess      PT LONG TERM GOAL #2   Title  Pt will be able tolerate sitting for >/= 30 minutes with pain </= 2/10.     Baseline  1 hour , 45 minutes without increased pain.     Period  Weeks    Status  Achieved      PT LONG TERM GOAL #3   Title  Pt will improve R LE hip strength to 5/5 in order to improve functional mobilty and gait.     Time  8    Period  Weeks    Status  On-going            Plan - 08/14/17 1318    Clinical Impression Statement  Pt arrives reporting relief after Treatment yesterday. She reports lumbar stiffness today.  Recommended she end with prone stretches as she has centralization of sx with this. Worked on right piriformis tnederness and repeated IFC and Traction. Introdeuced Lumbar decompression brace. She would need a smaller size however has significant relief from Lumbar traction and should benefit form this brace.     PT Next Visit Plan  Give HEP pics of POE, and prone pressups. Mechanical traction, Lumbar stretching and strengthening, Hip stretching/strenghtening, simulate lowering makeup case into trunk, continue IFC-give TENS info, did order come back for Decompression brace?    PT Home Exercise Plan  bridges, trunk rotation in hook lying, piriformis stretch, SLR, green band clam , supine bridge with green band clam , cervical rotation snags    Consulted and Agree with Plan of Care  Patient       Patient will benefit from skilled therapeutic intervention in order to improve the following deficits and impairments:  Pain, Decreased activity tolerance, Difficulty walking, Decreased mobility, Decreased strength  Visit Diagnosis: Muscle weakness (generalized)  Acute right-sided low back pain with right-sided sciatica     Problem List Patient Active Problem List   Diagnosis Date Noted  . Hemoperitoneum 08/04/2015  . Abdominal pain in female 08/04/2015  . S/P colonoscopy 08/04/2015  . GERD (gastroesophageal reflux disease)   . Gastroesophageal reflux disease with esophagitis     Sherrie Mustache, PTA 08/14/2017, 1:21 PM  [facility] [address] [place], [place], [address] Phone: [phone]   Fax:  [phone]  Name: Marcia Walker MRN: [id number] Date of Birth: [birth year]

## 2017-08-20 ENCOUNTER — Ambulatory Visit: Payer: BLUE CROSS/BLUE SHIELD | Admitting: Physical Therapy

## 2017-08-22 ENCOUNTER — Encounter (INDEPENDENT_AMBULATORY_CARE_PROVIDER_SITE_OTHER): Payer: Self-pay | Admitting: Orthopaedic Surgery

## 2017-08-22 ENCOUNTER — Ambulatory Visit (INDEPENDENT_AMBULATORY_CARE_PROVIDER_SITE_OTHER): Payer: BLUE CROSS/BLUE SHIELD | Admitting: Orthopaedic Surgery

## 2017-08-22 ENCOUNTER — Encounter: Payer: Self-pay | Admitting: Physical Therapy

## 2017-08-22 ENCOUNTER — Ambulatory Visit: Payer: BLUE CROSS/BLUE SHIELD | Admitting: Physical Therapy

## 2017-08-22 VITALS — BP 116/60 | HR 72 | Ht 62.0 in | Wt 111.0 lb

## 2017-08-22 DIAGNOSIS — M5441 Lumbago with sciatica, right side: Secondary | ICD-10-CM

## 2017-08-22 DIAGNOSIS — G8929 Other chronic pain: Secondary | ICD-10-CM

## 2017-08-22 NOTE — Progress Notes (Signed)
Office Visit Note   Patient: Marcia Walker           Date of Birth: 1955-12-05           MRN: [id number] Visit Date: 08/22/2017              Requested by: Devra Dopp, MD [address] [place], [place] [address] PCP: Devra Dopp, MD   Assessment & Plan: Visit Diagnoses:  1. Chronic right-sided low back pain with right-sided sciatica     Plan: TENS unit for recurrent low back pain.  Presently not having any neurologic changes.  Has back support.  Continue physical therapy.  Has had a temporary exacerbation of her prior back pain  Follow-Up Instructions: Return if symptoms worsen or fail to improve.   Orders:  No orders of the defined types were placed in this encounter.  No orders of the defined types were placed in this encounter.     Procedures: No procedures performed   Clinical Data: No additional findings.   Subjective: Chief Complaint  Patient presents with  . Right Hip - Pain  . Follow-up    HAVING LEFT HIP PAIN STERTED TO GET BETTER BUT 2 WEEKS AGO PAIN CAME BACK AFTER PUTTING THINGS IN HER TRUNK STILL RADIATING FROM RIGHT SHOULDER TO BACK TO HIP TO TOP OF FOOT, GOING TO PT HAVE 1 VISIT LEFT.   Ms Strohecker has been followed recently for the problems referable to her low back pain with right lower extremity radiculopathy.  She is had an MRI scan performed about 2 months ago demonstrating a far lateral disc protrusion at L3-4.  She is been using a back support and going to physical therapy and feeling much better.  About 2 weeks ago she was lifting heavy object and had some recurrent pain.  She is better now the therapist suggested she might want to consider a TENS unit.  HPI  Review of Systems  Constitutional: Positive for fatigue.  HENT: Negative for ear pain.   Eyes: Positive for pain.  Respiratory: Positive for cough.   Cardiovascular: Positive for leg swelling.  Gastrointestinal: Negative for constipation and diarrhea.    Genitourinary: Negative for difficulty urinating.  Musculoskeletal: Positive for back pain and neck pain.  Skin: Negative for rash.  Allergic/Immunologic: Positive for food allergies.  Neurological: Positive for weakness. Negative for numbness.  Psychiatric/Behavioral: Positive for sleep disturbance.     Objective: Vital Signs: BP 116/60 (BP Location: Left Arm, Patient Position: Sitting, Cuff Size: Normal)   Pulse 72   Ht  (1.575 m)   Wt 111 lb (50.3 kg)   BMI 20.30 kg/m   Physical Exam  Constitutional: She is oriented to person, place, and time. She appears well-developed and well-nourished.  HENT:  Mouth/Throat: Oropharynx is clear and moist.  Eyes: Pupils are equal, round, and reactive to light. EOM are normal.  Pulmonary/Chest: Effort normal.  Neurological: She is alert and oriented to person, place, and time.  Skin: Skin is warm and dry.  Psychiatric: She has a normal mood and affect. Her behavior is normal.    Ortho Exam awake alert and oriented x3.  Comfortable sitting.  Straight leg raise negative bilaterally.  Reflexes were symmetrical.  Motor and sensory exam intact.  No pain with range of motion of either hip or either knee.  No distal edema.  Just minimal discomfort in the right para lumbosacral region.  No masses.  Good mobility.  No significant pain  to range of motion of cervical spine  Specialty Comments:  No specialty comments available.  Imaging: No results found.   PMFS History: Patient Active Problem List   Diagnosis Date Noted  . Hemoperitoneum 08/04/2015  . Abdominal pain in female 08/04/2015  . S/P colonoscopy 08/04/2015  . GERD (gastroesophageal reflux disease)   . Gastroesophageal reflux disease with esophagitis    Past Medical History:  Diagnosis Date  . Felon of finger of right hand with lymphangitis 12/17/2011  . GERD (gastroesophageal reflux disease)   . Headache(784.0)    with allergies    Family History  Problem Relation Age of  Onset  . Hypertension Mother   . Diabetes Father   . Diabetes Paternal Grandmother        also gluacoma    Past Surgical History:  Procedure Laterality Date  . I&D EXTREMITY  12/17/2011   Procedure: IRRIGATION AND DEBRIDEMENT EXTREMITY;  Surgeon: Marlowe Shores, MD;  Location: [facility] OR;  Service: Orthopedics;  Laterality: Right;  Right Ring Finger  . left shouler manipulation  01/2010  . OTHER SURGICAL HISTORY  15 yrs ago   cyst removed from end of spine  . right shoulder manipulation 03/2011  03/2011  . UTERINE FIBROID SURGERY  2007   Social History   Occupational History  . Not on file  Tobacco Use  . Smoking status: Never Smoker  . Smokeless tobacco: Never Used  Substance and Sexual Activity  . Alcohol use: No  . Drug use: No  . Sexual activity: Not on file

## 2017-08-22 NOTE — Therapy (Signed)
[facility] Outpatient Rehabilitation [facility] [address] [place], [place], [address] Phone: [phone]   Fax:  [phone]  Physical Therapy Treatment  Patient Details  Name: Marcia Walker MRN: [id number] Date of Birth: 1955-09-17 Referring Provider: Norlene Campbell, MD   Encounter Date: 08/22/2017  PT End of Session - 08/22/17 1556    Visit Number  11    Number of Visits  16    Date for PT Re-Evaluation  09/02/17    PT Start Time  1525    PT Stop Time  1556    PT Time Calculation (min)  31 min       Past Medical History:  Diagnosis Date  . Felon of finger of right hand with lymphangitis 12/17/2011  . GERD (gastroesophageal reflux disease)   . Headache(784.0)    with allergies    Past Surgical History:  Procedure Laterality Date  . I&D EXTREMITY  12/17/2011   Procedure: IRRIGATION AND DEBRIDEMENT EXTREMITY;  Surgeon: Marlowe Shores, MD;  Location: [facility] OR;  Service: Orthopedics;  Laterality: Right;  Right Ring Finger  . left shouler manipulation  01/2010  . OTHER SURGICAL HISTORY  15 yrs ago   cyst removed from end of spine  . right shoulder manipulation 03/2011  03/2011  . UTERINE FIBROID SURGERY  2007    There were no vitals filed for this visit.  Subjective Assessment - 08/22/17 1537    Subjective  Patient arrives today requesting IFC for pain relief, planning on taking 10 hour drive soon    Pertinent History  L3-4 lateral foraminal protrusion, L3-4 nerve root irritation    Patient Stated Goals  Stop hurting, be able to work without pain    Currently in Pain?  Yes    Pain Score  8     Pain Location  Back    Pain Orientation  Right;Lower    Pain Descriptors / Indicators  Aching                       [facility] Adult PT Treatment/Exercise - 08/22/17 0001      Electrical Stimulation   Electrical Stimulation Location  Right lumbar/gluteal    Electrical Stimulation Action  IFC x20 minutes     Electrical Stimulation Parameters   11-82mA     Electrical Stimulation Goals  Pain             PT Education - 08/22/17 1549    Education provided  Yes    Education Details  Where to purchase TENS, how to place electrodes, refer to owner's manual for set up and intensity and models vary    Person(s) Educated  Patient    Methods  Explanation    Comprehension  Verbalized understanding       PT Short Term Goals - 07/30/17 1605      PT SHORT TERM GOAL #1   Title  Pt will be independent in her HEP and progression.     Time  4    Period  Weeks    Status  Achieved      PT SHORT TERM GOAL #2   Title  Pt will be able to report pain </= 4/10 with ADL's.     Baseline  average4/10      Period  Weeks    Status  Achieved        PT Long Term Goals - 08/06/17 1000      PT LONG TERM GOAL #1  Title  Pt will improve FOTO school from 58% limitation to </= 38% limitation.     Time  8    Period  Weeks    Status  Unable to assess      PT LONG TERM GOAL #2   Title  Pt will be able tolerate sitting for >/= 30 minutes with pain </= 2/10.     Baseline  1 hour , 45 minutes without increased pain.     Period  Weeks    Status  Achieved      PT LONG TERM GOAL #3   Title  Pt will improve R LE hip strength to 5/5 in order to improve functional mobilty and gait.     Time  8    Period  Weeks    Status  On-going            Plan - 08/22/17 1550    Clinical Impression Statement  Pt present today with MD RX for TENS unit expecting that we were able to dospense one today. Explained we do not stock and offered to set her up for IFC and help her locate one before 5 pm today so she would have it for her 10 hour drive tonight. Spent time educating patient on general precuations with TENS and advised her to refer to owner's manual of item she purchases. Pt had additional questions about lumbar decompression brace. Recommended she talk to her primary therapist at next visit. Pt reported 0/10 pain after session and was headed to  purchase TENS at [facility].     PT Treatment/Interventions  Electrical Stimulation;Cryotherapy;Moist Heat;Traction;Ultrasound;Gait training;Stair training;Functional mobility training;Therapeutic activities;Therapeutic exercise;Manual techniques;Taping;Dry needling;Passive range of motion;Patient/family education    PT Next Visit Plan  Give HEP pics of POE, and prone pressups. Mechanical traction, Lumbar stretching and strengthening, Hip stretching/strenghtening, simulate lowering makeup case into trunk, continue IFC-give TENS info, did order come back for Decompression brace?    Consulted and Agree with Plan of Care  Patient       Patient will benefit from skilled therapeutic intervention in order to improve the following deficits and impairments:     Visit Diagnosis: Acute right-sided low back pain with right-sided sciatica     Problem List Patient Active Problem List   Diagnosis Date Noted  . Hemoperitoneum 08/04/2015  . Abdominal pain in female 08/04/2015  . S/P colonoscopy 08/04/2015  . GERD (gastroesophageal reflux disease)   . Gastroesophageal reflux disease with esophagitis    [name] initaited session and set-up TENS and I completed session.   [place], PT 08/22/2017, 4:00 PM  [facility] [address] [place], [place], [address] Phone: [phone]   Fax:  [phone]  Name: Marcia Walker MRN: [id number] Date of Birth: 1955-12-07

## 2017-08-23 ENCOUNTER — Encounter

## 2017-08-29 ENCOUNTER — Ambulatory Visit: Payer: BLUE CROSS/BLUE SHIELD | Admitting: Physical Therapy

## 2017-08-29 ENCOUNTER — Encounter: Payer: Self-pay | Admitting: Physical Therapy

## 2017-08-29 DIAGNOSIS — M5441 Lumbago with sciatica, right side: Secondary | ICD-10-CM

## 2017-08-29 DIAGNOSIS — M6281 Muscle weakness (generalized): Secondary | ICD-10-CM
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
## 2017-08-30 ENCOUNTER — Ambulatory Visit (INDEPENDENT_AMBULATORY_CARE_PROVIDER_SITE_OTHER): Payer: BLUE CROSS/BLUE SHIELD | Admitting: Orthopaedic Surgery

## 2017-09-05 ENCOUNTER — Ambulatory Visit: Payer: BLUE CROSS/BLUE SHIELD | Attending: Orthopaedic Surgery | Admitting: Physical Therapy

## 2017-09-05 ENCOUNTER — Encounter: Payer: Self-pay | Admitting: Physical Therapy

## 2017-09-05 DIAGNOSIS — M5441 Lumbago with sciatica, right side: Secondary | ICD-10-CM | POA: Diagnosis present

## 2017-09-05 DIAGNOSIS — M6281 Muscle weakness (generalized): Secondary | ICD-10-CM | POA: Insufficient documentation
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
## 2017-10-22 ENCOUNTER — Telehealth (INDEPENDENT_AMBULATORY_CARE_PROVIDER_SITE_OTHER): Payer: Self-pay | Admitting: Orthopaedic Surgery

## 2017-10-22 NOTE — Telephone Encounter (Signed)
LEFT MESSAGE TO SEE IF PT WANTS BACK INJECTION

## 2017-10-22 NOTE — Telephone Encounter (Signed)
Suggest lumbar spine injection-please call pt and schedule with Dr Alvester MorinNewton if she agrees

## 2017-10-22 NOTE — Telephone Encounter (Signed)
PLEASE ADVISE.

## 2017-10-22 NOTE — Telephone Encounter (Signed)
Patient called stating she has completed her physical therapy and is currently back to work.  Patient states her back is still painful with sitting and sleeping and isn't sure if Dr. Cleophas DunkerWhitfield wanted her to schedule a follow-up appointment.

## 2017-11-29 ENCOUNTER — Telehealth (INDEPENDENT_AMBULATORY_CARE_PROVIDER_SITE_OTHER): Payer: Self-pay | Admitting: Orthopaedic Surgery

## 2017-11-29 ENCOUNTER — Other Ambulatory Visit (INDEPENDENT_AMBULATORY_CARE_PROVIDER_SITE_OTHER): Payer: Self-pay | Admitting: Radiology

## 2017-11-29 DIAGNOSIS — M545 Low back pain: Principal | ICD-10-CM

## 2017-11-29 DIAGNOSIS — G8929 Other chronic pain: Secondary | ICD-10-CM

## 2017-11-29 NOTE — Telephone Encounter (Signed)
PLEASE ADVISE.

## 2017-11-29 NOTE — Telephone Encounter (Signed)
SENT IN REFERAL

## 2017-11-29 NOTE — Telephone Encounter (Signed)
Ok to refer to same PT

## 2017-11-29 NOTE — Telephone Encounter (Signed)
Patient does not want to get an injection if her back if possible. Patient very afraid of needles. Patient would prefer to do PT again, if possible. [facility]

## 2017-12-12 ENCOUNTER — Ambulatory Visit: Payer: BLUE CROSS/BLUE SHIELD | Attending: Orthopaedic Surgery | Admitting: Physical Therapy

## 2017-12-12 ENCOUNTER — Encounter: Payer: Self-pay | Admitting: Physical Therapy

## 2017-12-12 DIAGNOSIS — G8929 Other chronic pain: Secondary | ICD-10-CM | POA: Diagnosis present

## 2017-12-12 DIAGNOSIS — M62830 Muscle spasm of back: Secondary | ICD-10-CM | POA: Diagnosis present

## 2017-12-12 DIAGNOSIS — M545 Low back pain: Secondary | ICD-10-CM | POA: Diagnosis present

## 2017-12-12 DIAGNOSIS — M5441 Lumbago with sciatica, right side: Secondary | ICD-10-CM | POA: Insufficient documentation

## 2017-12-12 DIAGNOSIS — M6281 Muscle weakness (generalized): Secondary | ICD-10-CM | POA: Diagnosis present
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
## 2017-12-25 ENCOUNTER — Ambulatory Visit: Payer: BLUE CROSS/BLUE SHIELD | Admitting: Physical Therapy

## 2017-12-25 ENCOUNTER — Encounter: Payer: Self-pay | Admitting: Physical Therapy

## 2017-12-25 DIAGNOSIS — M545 Low back pain, unspecified: Secondary | ICD-10-CM

## 2017-12-25 DIAGNOSIS — M6281 Muscle weakness (generalized): Secondary | ICD-10-CM | POA: Diagnosis not present

## 2017-12-25 DIAGNOSIS — M62830 Muscle spasm of back: Secondary | ICD-10-CM

## 2017-12-25 DIAGNOSIS — G8929 Other chronic pain: Secondary | ICD-10-CM

## 2017-12-25 DIAGNOSIS — M5441 Lumbago with sciatica, right side: Secondary | ICD-10-CM

## 2017-12-25 NOTE — Therapy (Signed)
[facility] Outpatient Rehabilitation [facility] [address] [place], [place], [address] Phone: [phone]   Fax:  [phone]  Physical Therapy Treatment  Patient Details  Name: Marcia Walker MRN: [id number] Date of Birth: 09/10/1955 Referring Provider: Dr Norlene Campbell   Encounter Date: 12/25/2017  PT End of Session - 12/25/17 0916    Visit Number  2    Number of Visits  12    Date for PT Re-Evaluation  01/23/18    Authorization Type  BCBS    PT Start Time  [phone]   Patient 8 minutes late    PT Stop Time  0932    PT Time Calculation (min)  39 min    Activity Tolerance  Patient tolerated treatment well    Behavior During Therapy  [facility] for tasks assessed/performed       Past Medical History:  Diagnosis Date  . Felon of finger of right hand with lymphangitis 12/17/2011  . GERD (gastroesophageal reflux disease)   . Headache(784.0)    with allergies    Past Surgical History:  Procedure Laterality Date  . I&D EXTREMITY  12/17/2011   Procedure: IRRIGATION AND DEBRIDEMENT EXTREMITY;  Surgeon: Marlowe Shores, MD;  Location: [facility] OR;  Service: Orthopedics;  Laterality: Right;  Right Ring Finger  . left shouler manipulation  01/2010  . OTHER SURGICAL HISTORY  15 yrs ago   cyst removed from end of spine  . right shoulder manipulation 03/2011  03/2011  . UTERINE FIBROID SURGERY  2007    There were no vitals filed for this visit.  Subjective Assessment - 12/25/17 0909    Subjective  Patientreport she felt much stronger after her last visit. She reports the feeling lasted for severaldays but her pain has came back over the last few. She is not sure she is doing the exercises right.     Pertinent History  L3-4 lateral foraminal protrusion, L3-4 nerve root irritation    Limitations  Sitting;House hold activities;Lifting    How long can you sit comfortably?  up to 30'    How long can you stand comfortably?  unlimited    Patient Stated Goals  reduce pain and be  able to use her back/body to return to her PLOF with work and lifting    Currently in Pain?  Yes    Pain Score  7     Pain Location  Back    Pain Orientation  Right;Left;Lower    Pain Descriptors / Indicators  Aching;Tightness    Pain Type  Chronic pain    Pain Onset  More than a month ago    Pain Frequency  Intermittent    Aggravating Factors   sititng, lying     Pain Relieving Factors  heat and massage,     Effect of Pain on Daily Activities  difficulty sleeping                        [facility] Adult PT Treatment/Exercise - 12/25/17 0001      Lumbar Exercises: Prone   Other Prone Lumbar Exercises  reviewed pelvic press series press x10; press with knee flexion 2x5 each; press with extension x5 on the right x10 on the left;  reviewed prone superman given last visit.       Manual Therapy   Manual therapy comments  LAD in supine 3x20 sec hold bilateral     Soft tissue mobilization  trigger point release to lumbar spine  PT Education - 12/25/17 0916    Education provided  Yes    Education Details  reviewed current HEP     Person(s) Educated  Patient    Methods  Explanation;Demonstration;Tactile cues;Verbal cues    Comprehension  Verbalized understanding;Returned demonstration;Verbal cues required;Tactile cues required       PT Short Term Goals - 12/12/17 1506      PT SHORT TERM GOAL #1   Title  -----      PT SHORT TERM GOAL #2   Title  -------        PT Long Term Goals - 12/12/17 1506      PT LONG TERM GOAL #1   Title  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     Time  6    Period  Weeks    Status  New    Target Date  01/23/18      PT LONG TERM GOAL #2   Title  pt will demo strong and symetrical TA and multifidi contraction while performing her core work     Time  6    Period  Weeks    Status  New    Target Date  01/23/18      PT LONG TERM GOAL #3   Title  Pt will improve R LE hip strength to 5/5  in order to improve functional mobilty and gait.     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 She wanted to review her HEP. She felt like she was doing something wrong. She was not doingher pelvi press at home. She had pain with prone extension. She was able to do it last time she reported. Therapy reviewed her other exercises and but at she was having pain. Therapy switched focus to soft tissue mobilization and gentle PA mobilization.  She was advised at home to perfrom exercises that dont cause signficant pain.     Clinical Presentation  Stable    Clinical Decision Making  Low    Rehab Potential  Good    PT Frequency  2x / week  PT Duration  6 weeks    PT Treatment/Interventions  Electrical Stimulation;Cryotherapy;Moist Heat;Traction;Ultrasound;Gait training;Stair training;Functional mobility training;Therapeutic activities;Therapeutic exercise;Manual techniques;Taping;Dry needling;Passive range of motion;Patient/family education;Neuromuscular re-education    PT Next Visit Plan  progress HEP, really engage TA and multifidi, progress to functional movements, possible manual  work to [employer] - teach self trigger point release with ball    PT Home Exercise Plan  bridges, trunk rotation in hook lying, piriformis stretch, SLR, green band clam , supine bridge with green band clam , cervical rotation snags    Consulted and Agree with Plan of Care  Patient       Patient will benefit from skilled therapeutic intervention in order to improve the following deficits and impairments:  Pain, Increased muscle spasms, Decreased activity tolerance, Decreased range of motion, Decreased strength  Visit Diagnosis: Muscle weakness (generalized)  Chronic right-sided low back pain without sciatica  Chronic bilateral low back pain without sciatica  Muscle spasm of back  Acute right-sided low back pain with right-sided sciatica     Problem List Patient Active Problem List   Diagnosis Date Noted  . Hemoperitoneum 08/04/2015  . Abdominal pain in female 08/04/2015  . S/P colonoscopy 08/04/2015  . GERD (gastroesophageal reflux disease)   . Gastroesophageal reflux disease with esophagitis     Dessie Coma PT DPT  12/25/2017, 3:29 PM  [facility] [address] [place], [place], [address] Phone: [phone]   Fax:  [phone]  Name: Marcia Walker MRN: [id number] Date of Birth: 1955-06-28

## 2017-12-27 ENCOUNTER — Ambulatory Visit: Payer: BLUE CROSS/BLUE SHIELD | Admitting: Physical Therapy

## 2017-12-27 ENCOUNTER — Encounter: Payer: Self-pay | Admitting: Physical Therapy

## 2017-12-27 DIAGNOSIS — M545 Low back pain: Secondary | ICD-10-CM

## 2017-12-27 DIAGNOSIS — M62830 Muscle spasm of back: Secondary | ICD-10-CM

## 2017-12-27 DIAGNOSIS — G8929 Other chronic pain: Secondary | ICD-10-CM

## 2017-12-27 DIAGNOSIS — M6281 Muscle weakness (generalized): Secondary | ICD-10-CM | POA: Diagnosis not present

## 2017-12-27 DIAGNOSIS — M5441 Lumbago with sciatica, right side: Secondary | ICD-10-CM

## 2017-12-27 NOTE — Therapy (Signed)
[facility] Outpatient Rehabilitation [facility] [address] [place], [place], [address] Phone: [phone]   Fax:  [phone]  Physical Therapy Treatment  Patient Details  Name: Marcia Walker MRN: [id number] Date of Birth: 1955-09-02 Referring Provider (PT): Dr Norlene Campbell   Encounter Date: 12/27/2017  PT End of Session - 12/27/17 0829    Visit Number  3    Number of Visits  12    Date for PT Re-Evaluation  01/23/18    Authorization Type  BCBS    PT Start Time  0807   Patient was 7 minutes late to his appointment    PT Stop Time  0845    PT Time Calculation (min)  38 min    Activity Tolerance  Patient tolerated treatment well    Behavior During Therapy  [facility] for tasks assessed/performed       Past Medical History:  Diagnosis Date  . Felon of finger of right hand with lymphangitis 12/17/2011  . GERD (gastroesophageal reflux disease)   . Headache(784.0)    with allergies    Past Surgical History:  Procedure Laterality Date  . I&D EXTREMITY  12/17/2011   Procedure: IRRIGATION AND DEBRIDEMENT EXTREMITY;  Surgeon: Marlowe Shores, MD;  Location: [facility] OR;  Service: Orthopedics;  Laterality: Right;  Right Ring Finger  . left shouler manipulation  01/2010  . OTHER SURGICAL HISTORY  15 yrs ago   cyst removed from end of spine  . right shoulder manipulation 03/2011  03/2011  . UTERINE FIBROID SURGERY  2007    There were no vitals filed for this visit.  Subjective Assessment - 12/27/17 0814    Subjective  Patient reports her back has felt much stronger. She has been sleeping flat at night without pain. She is having no pain radiating down her leg. She was     Pertinent History  L3-4 lateral foraminal protrusion, L3-4 nerve root irritation    Limitations  Sitting;House hold activities;Lifting    How long can you sit comfortably?  up to 30'    How long can you stand comfortably?  unlimited    Patient Stated Goals  reduce pain and be able to use her  back/body to return to her PLOF with work and lifting    Currently in Pain?  Yes    Pain Score  4     Pain Location  Back    Pain Orientation  Right;Left;Lower    Pain Descriptors / Indicators  Aching    Pain Type  Chronic pain    Pain Onset  More than a month ago    Pain Frequency  Intermittent    Aggravating Factors   sitting and lying     Pain Relieving Factors  hand and massage     Effect of Pain on Daily Activities  difficulty sleeping     Multiple Pain Sites  No    Pain Frequency  Intermittent    Aggravating Factors   unsure, work          [facility] PT Assessment - 12/27/17 0001      Assessment   Medical Diagnosis  low back pain,    Referring Provider (PT)  Dr Norlene Campbell    Onset Date/Surgical Date  09/24/17   flare up   Hand Dominance  Right    Prior Therapy  yes March - June 2019      Precautions   Precautions  None      Home Environment  Living Environment  Private residence      Prior Function   Level of Independence  Independent    Vocation  Full time employment    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[employer], 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#      Observation/Other Assessments   Focus on Therapeutic Outcomes (FOTO)   53% limited      AROM   Cervical Flexion  35    Cervical Extension  60    Cervical - Right Rotation  62    Cervical - Left Rotation  55    Lumbar Flexion  to the floor, tightness in low back     Lumbar Extension  50% present    Lumbar - Right Rotation  WFL    Lumbar - Left Rotation  WFL      Strength   Right Hip Flexion  --   5-/5   Right Hip Extension  4-/5    Right Hip ABduction  4/5    Left Hip Flexion  5/5    Left Hip Extension  5/5    Left Hip ABduction  5/5    Lumbar Flexion  --   TA fair   Lumbar Extension  --   multifidi, fair, Rt with delayed firing      Flexibility   Soft Tissue Assessment /Muscle Length  yes    Hamstrings  bilat WNL    Quadriceps  bilat WNL    Piriformis  bilat WNL     Quadratus Lumborum  bilat WNL      Palpation   Spinal mobility  CPA and bilat UPA WNL with grade III mobs, slight tenderness CPA L3 and Rt UPA L4-2    Palpation comment  Pt with tenderness noted over lumbar paraspinals and pinpoint tenderness over L3 and L4 on the right side      Special Tests   Other special tests  (-) lumbar and sacral special tests                   [facility] Adult PT Treatment/Exercise - 12/27/17 0001      Lumbar Exercises: Stretches   Active Hamstring Stretch  3 reps;30 seconds    Piriformis Stretch  Right;3 reps;20 seconds    Other Lumbar Stretch Exercise  lower trunk rotation x15 bilateral       Lumbar Exercises: Supine   Clam Limitations  x15 reen bilateral with cuing for breathing     Other Supine Lumbar Exercises  supine march 2x10 with stablization       Lumbar Exercises: Prone   Other Prone Lumbar Exercises  reviewed pelvic press series press x10; press with knee flexion x10 each; press with extension right x10 on the leftx 10 ;        Manual Therapy   Manual therapy comments  LAD in supine 3x20 sec hold bilateral     Soft tissue mobilization  trigger point release to lumbar spine              PT Education - 12/27/17 0829    Education provided  Yes    Education Details  reviewed lifting technique     Person(s) Educated  Patient    Methods  Explanation;Demonstration;Tactile cues;Verbal cues    Comprehension  Verbalized understanding;Returned demonstration;Verbal cues required;Tactile cues required       PT Short Term Goals - 12/27/17 1327      PT SHORT TERM GOAL #1   Title  Patient will be independent with HEP     Time  4    Period  Weeks    Status  Achieved        PT Long Term Goals - 12/12/17 1506      PT LONG TERM GOAL #1   Title  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     Time  6    Period  Weeks    Status  New    Target Date  01/23/18      PT LONG TERM GOAL #2   Title  pt will  demo strong and symetrical TA and multifidi contraction while performing her core work     Time  6    Period  Weeks    Status  New    Target Date  01/23/18      PT LONG TERM GOAL #3   Title  Pt will improve R LE hip strength to 5/5 in order to improve functional mobilty and gait.     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 She did a great job of hinging her hips and coordinating her breathing. Sh ehad no pain with her box lift. She tolerated ther-ex today. She is making good progress.     Clinical Presentation  Stable    Clinical Decision Making  Low    Rehab Potential  Good    PT Frequency  2x / week    PT Duration  6 weeks    PT Treatment/Interventions  Electrical Stimulation;Cryotherapy;Moist Heat;Traction;Ultrasound;Gait training;Stair training;Functional mobility training;Therapeutic activities;Therapeutic exercise;Manual techniques;Taping;Dry needling;Passive range of motion;Patient/family education;Neuromuscular re-education    PT Next Visit Plan  progress HEP, really engage TA and multifidi, progress to functional movements, possible manual work to [employer] - teach self  trigger point release with ball    PT Home Exercise Plan  bridges, trunk rotation in hook lying, piriformis stretch, SLR, green band clam , supine bridge with green band clam , cervical rotation snags    Consulted and Agree with Plan of Care  Patient       Patient will benefit  from skilled therapeutic intervention in order to improve the following deficits and impairments:  Pain, Increased muscle spasms, Decreased activity tolerance, Decreased range of motion, Decreased strength  Visit Diagnosis: Muscle weakness (generalized)  Chronic right-sided low back pain without sciatica  Chronic bilateral low back pain without sciatica  Muscle spasm of back  Acute right-sided low back pain with right-sided sciatica     Problem List Patient Active Problem List   Diagnosis Date Noted  . Hemoperitoneum 08/04/2015  . Abdominal pain in female 08/04/2015  . S/P colonoscopy 08/04/2015  . GERD (gastroesophageal reflux disease)   . Gastroesophageal reflux disease with esophagitis     Dessie Coma PT DPT  12/27/2017, 1:28 PM  [facility] [address] [place], [place], [address] Phone: [phone]   Fax:  [phone]  Name: Marcia Walker MRN: [id number] Date of Birth: [birth year]

## 2017-12-31 ENCOUNTER — Encounter: Payer: Self-pay | Admitting: Physical Therapy

## 2017-12-31 ENCOUNTER — Ambulatory Visit: Payer: BLUE CROSS/BLUE SHIELD | Attending: Orthopaedic Surgery | Admitting: Physical Therapy

## 2017-12-31 DIAGNOSIS — M5441 Lumbago with sciatica, right side: Secondary | ICD-10-CM | POA: Diagnosis present

## 2017-12-31 DIAGNOSIS — M545 Low back pain, unspecified: Secondary | ICD-10-CM

## 2017-12-31 DIAGNOSIS — M62830 Muscle spasm of back: Secondary | ICD-10-CM | POA: Insufficient documentation

## 2017-12-31 DIAGNOSIS — M6281 Muscle weakness (generalized): Secondary | ICD-10-CM

## 2017-12-31 DIAGNOSIS — G8929 Other chronic pain: Secondary | ICD-10-CM | POA: Diagnosis present

## 2017-12-31 NOTE — Therapy (Addendum)
[facility] Outpatient Rehabilitation [facility] [address] [place], [place], [address] Phone: [phone]   Fax:  [phone]  Physical Therapy Treatment  Patient Details  Name: Marcia Walker MRN: [id number] Date of Birth: [birth year] Referring Provider (PT): Dr Norlene Campbell   Encounter Date: 12/31/2017  PT End of Session - 12/31/17 0810    Visit Number  4    Number of Visits  12    Date for PT Re-Evaluation  01/23/18    Authorization Type  BCBS    PT Start Time  0810   pt 10 min late   PT Stop Time  0850    PT Time Calculation (min)  40 min    Activity Tolerance  Patient tolerated treatment well       Past Medical History:  Diagnosis Date  . Felon of finger of right hand with lymphangitis 12/17/2011  . GERD (gastroesophageal reflux disease)   . Headache(784.0)    with allergies    Past Surgical History:  Procedure Laterality Date  . I&D EXTREMITY  12/17/2011   Procedure: IRRIGATION AND DEBRIDEMENT EXTREMITY;  Surgeon: Marlowe Shores, MD;  Location: [facility] OR;  Service: Orthopedics;  Laterality: Right;  Right Ring Finger  . left shouler manipulation  01/2010  . OTHER SURGICAL HISTORY  15 yrs ago   cyst removed from end of spine  . right shoulder manipulation 03/2011  03/2011  . UTERINE FIBROID SURGERY  2007    There were no vitals filed for this visit.  Subjective Assessment - 12/31/17 0811    Subjective  Pt reports that her back feels stronger when she is doing the exercise.  Will be out of town the 10th - 17th of October.      Patient Stated Goals  reduce pain and be able to use her back/body to return to her PLOF with work and lifting    Currently in Pain?  --   feels tight today, slept funny last night.          [facility] PT Assessment - 12/31/17 0001      Assessment   Medical Diagnosis  low back pain,                   [facility] Adult PT Treatment/Exercise - 12/31/17 0001      Exercises   Exercises  Lumbar      Lumbar  Exercises: Stretches   Double Knee to Chest Stretch  20 seconds    Lower Trunk Rotation  --   10 reps   Prone on Elbows Stretch  --   with pelvic press   Press Ups  10 reps    Piriformis Stretch  Left;Right;30 seconds   after glut med work     Lumbar Exercises: Seated   Other Seated Lumbar Exercises  seated FWD reach with 3 sec holds using 3# wts, VC to relax shoulders      Lumbar Exercises: Supine   Other Supine Lumbar Exercises  2x10 hooklying lat pull overs holding 5# VC to keep TA engaged      Lumbar Exercises: Sidelying   Other Sidelying Lumbar Exercises  10 each side pilates FWD/BWD kicks, CS/CCW circles and FWD/BWD toe taps  VC to keep core engaged      Lumbar Exercises: Quadruped   Opposite Arm/Leg Raise  5 reps;Left arm/Right leg;Right arm/Left leg   10 sec hols   Opposite Arm/Leg Raise Limitations  VC to engage TA and maintain neutral spine  Modalities   Modalities  --               PT Short Term Goals - 12/31/17 0821      PT SHORT TERM GOAL #1   Title  Patient will be independent with HEP     Status  Achieved        PT Long Term Goals - 12/31/17 1610      PT LONG TERM GOAL #1   Title  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     Status  On-going      PT LONG TERM GOAL #2   Title  pt will demo strong and symetrical TA and multifidi contraction while performing her core work     Status  Achieved      PT LONG TERM GOAL #3   Title  Pt will improve R LE hip strength to 5/5 in order to improve functional mobilty and gait.     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  she will be challenged next week when she is out of town for a week having to work her long days. She was instructed in strengthening exercise today to work the areas she will need while she is working.  Hopefully this will strenthen her work muscles and ease her pain with her 14 hr days. Pt developed some Rt leg pain after ex, performed some prone work and it relieved the pain.     Rehab Potential  Good    PT Frequency  2x / week    PT Duration  6 weeks    PT Treatment/Interventions  Electrical Stimulation;Cryotherapy;Moist Heat;Traction;Ultrasound;Gait training;Stair training;Functional mobility training;Therapeutic activities;Therapeutic exercise;Manual techniques;Taping;Dry needling;Passive range of motion;Patient/family education;Neuromuscular re-education    PT Next Visit Plan  cont high level core strengthening and simulate carrying bags     Consulted and Agree with Plan of Care  Patient       Patient will benefit from skilled therapeutic intervention in order to improve the following deficits and impairments:  Pain, Increased muscle spasms, Decreased activity tolerance, Decreased range of motion, Decreased strength  Visit Diagnosis: Muscle weakness (generalized)  Chronic right-sided low back pain without sciatica  Chronic bilateral low back pain without sciatica  Muscle spasm of back  Acute right-sided low back pain with right-sided sciatica     Problem List Patient Active Problem List   Diagnosis Date Noted  . Hemoperitoneum 08/04/2015  . Abdominal pain in female 08/04/2015  . S/P colonoscopy 08/04/2015  . GERD (gastroesophageal reflux disease)   . Gastroesophageal reflux disease with esophagitis     Roderic Scarce PT  12/31/2017, 8:51 AM  [facility] [address] [place], [place], [address] Phone: [phone]   Fax:  [phone]  Name: WILLIETTE LOEWE MRN: [id number] Date of Birth: [birth year]

## 2018-01-02 ENCOUNTER — Ambulatory Visit: Payer: BLUE CROSS/BLUE SHIELD | Admitting: Physical Therapy

## 2018-01-03 ENCOUNTER — Other Ambulatory Visit: Payer: Self-pay

## 2018-01-03 ENCOUNTER — Ambulatory Visit: Payer: BLUE CROSS/BLUE SHIELD | Admitting: Physical Therapy

## 2018-01-03 DIAGNOSIS — M6281 Muscle weakness (generalized): Secondary | ICD-10-CM | POA: Diagnosis not present

## 2018-01-03 DIAGNOSIS — M545 Low back pain, unspecified: Secondary | ICD-10-CM

## 2018-01-03 DIAGNOSIS — G8929 Other chronic pain: Secondary | ICD-10-CM

## 2018-01-03 DIAGNOSIS — M5441 Lumbago with sciatica, right side: Secondary | ICD-10-CM

## 2018-01-03 DIAGNOSIS — M62830 Muscle spasm of back: Secondary | ICD-10-CM

## 2018-01-03 NOTE — Therapy (Signed)
[facility] [address] [place], [place], [address] Phone: [phone]   Fax:  [phone]  Physical Therapy Treatment  Patient Details  Name: Marcia Walker MRN: [id number] Date of Birth: July 15, 1955 Referring Provider (PT): Dr Norlene Campbell   Encounter Date: 01/03/2018  PT End of Session - 01/03/18 1108    Visit Number  5    Number of Visits  12    Date for PT Re-Evaluation  01/23/18    Authorization Type  BCBS    PT Start Time  1009    PT Stop Time  1107    PT Time Calculation (min)  58 min    Activity Tolerance  Patient tolerated treatment well    Behavior During Therapy  [facility] for tasks assessed/performed       Past Medical History:  Diagnosis Date  . Felon of finger of right hand with lymphangitis 12/17/2011  . GERD (gastroesophageal reflux disease)   . Headache(784.0)    with allergies    Past Surgical History:  Procedure Laterality Date  . I&D EXTREMITY  12/17/2011   Procedure: IRRIGATION AND DEBRIDEMENT EXTREMITY;  Surgeon: Marlowe Shores, MD;  Location: [facility] OR;  Service: Orthopedics;  Laterality: Right;  Right Ring Finger  . left shouler manipulation  01/2010  . OTHER SURGICAL HISTORY  15 yrs ago   cyst removed from end of spine  . right shoulder manipulation 03/2011  03/2011  . UTERINE FIBROID SURGERY  2007    There were no vitals filed for this visit.  Subjective Assessment - 01/03/18 1059    Subjective  Pt. reports exacerbation of LBP after spending 8 hours in the car yesterday as well as time sitting in church pew to attend funeral out of town. Symptoms localized to bilateral lumbar region.     Pertinent History  L3-4 lateral foraminal protrusion, L3-4 nerve root irritation    Limitations  Sitting;House hold activities;Lifting    How long can you sit comfortably?  up to 30'    How long can you stand comfortably?  unlimited    How long can you walk comfortably?  unlimited    Currently in Pain?  Yes     Pain Score  7     Pain Location  Back    Pain Orientation  --   bilateral lumbar region   Pain Descriptors / Indicators  Aching    Pain Type  Chronic pain    Pain Radiating Towards  Localized to lumbar region but does note radiating toward right lateral hip during (lumbar) STM    Pain Onset  More than a month ago    Pain Frequency  Constant    Aggravating Factors   sitting    Pain Relieving Factors  exercises, stretching    Effect of Pain on Daily Activities  decreased sitting tolerance    Multiple Pain Sites  No         [facility] PT Assessment - 01/03/18 0001      Assessment   Medical Diagnosis  low back pain      ROM / Strength   AROM / PROM / Strength  AROM      AROM   Cervical Flexion  35    Cervical Extension  50    Cervical - Right Rotation  80    Cervical - Left Rotation  70    Lumbar Flexion  to floor    Lumbar Extension  60%    Lumbar -  Right Side Bend  20    Lumbar - Left Side Bend  15    Lumbar - Right Rotation  WFL    Lumbar - Left Rotation  [facility]      Strength   Right Hip Extension  4/5    Right Hip External Rotation   4+/5    Right Hip ABduction  4+/5    Left Hip Extension  5/5    Left Hip ABduction  5/5                   [facility] Adult PT Treatment/Exercise - 01/03/18 0001      Lumbar Exercises: Stretches   Piriformis Stretch  30 seconds;Right;3 reps    Other Lumbar Stretch Exercise  manual QL stretch bilat. 3x30 seconds    Other Lumbar Stretch Exercise  R lateral hip/glut stretch 3x30 seconds      Lumbar Exercises: Supine   Bridge  20 reps      Modalities   Modalities  Moist Heat      Moist Heat Therapy   Number Minutes Moist Heat  10 Minutes    Moist Heat Location  Lumbar Spine   in prone using face rest     Manual Therapy   Manual Therapy  Soft tissue mobilization    Soft tissue mobilization  STM to bilateral lumbar paraspinals, QL, and right lateral hip/gluteus medius and piriformis region             PT Education -  01/03/18 1108    Education provided  Yes    Education Details  tennis ball release right gluteus medius, stretches for QL and lateral hip    Person(s) Educated  Patient    Methods  Explanation;Demonstration;Tactile cues;Verbal cues    Comprehension  Verbalized understanding;Returned demonstration       PT Short Term Goals - 01/03/18 1112      PT SHORT TERM GOAL #1   Title  Patient will be independent with HEP     Time  4    Period  Weeks    Status  Achieved        PT Long Term Goals - 01/03/18 1113      PT LONG TERM GOAL #1   Title  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     Baseline  33% limited     Time  6    Period  Weeks    Status  On-going      PT LONG TERM GOAL #2   Title  pt will demo strong and symetrical TA and multifidi contraction while performing her core work     Baseline  1 hour , 45 minutes without increased pain.     Time  6    Period  Weeks    Status  Achieved      PT LONG TERM GOAL #3   Title  Pt will improve R LE hip strength to 5/5 in order to improve functional mobilty and gait.     Baseline  Currently 4/5 ext, 4+/5 abd    Time  6    Period  Weeks    Status  On-going      PT LONG TERM GOAL #4   Title  demo cervical and lumbar ROM WNL without pain to allow her to get in positions for her job    Baseline  still some limitation of extension    Time  6  Period  Weeks    Status  On-going      PT LONG TERM GOAL #6   Title  perform her work activities with no more than 2/10 pain at the end of a 14 hr day that settles down with rest    Time  6    Period  Weeks    Status  On-going            Plan - 01/03/18 1109    Clinical Impression Statement  Some setback with increased soreness/lumbar pain as noted in subjective from car travel. Suspect significant myofascial contribution to symptoms to manual therapy focus today with treatment to address. As symptoms improve plan reintroduce further progression of  lumbar stabilization exercises.    Clinical Presentation  Stable    Rehab Potential  Good    PT Frequency  2x / week    PT Duration  6 weeks    PT Treatment/Interventions  Electrical Stimulation;Cryotherapy;Moist Heat;Traction;Ultrasound;Gait training;Stair training;Functional mobility training;Therapeutic activities;Therapeutic exercise;Manual techniques;Taping;Dry needling;Passive range of motion;Patient/family education;Neuromuscular re-education    PT Next Visit Plan  Pending pain level and response to previous treatment plan more focus exercise progression with previous stabilization exercises, further STM as needed    PT Home Exercise Plan  bridges, trunk rotation in hook lying, piriformis stretch, SLR, green band clam , supine bridge with green band clam , cervical rotation snags    Consulted and Agree with Plan of Care  Patient       Patient will benefit from skilled therapeutic intervention in order to improve the following deficits and impairments:  Pain, Increased muscle spasms, Decreased activity tolerance, Decreased range of motion, Decreased strength  Visit Diagnosis: Muscle weakness (generalized)  Chronic right-sided low back pain without sciatica  Chronic bilateral low back pain without sciatica  Muscle spasm of back  Acute right-sided low back pain with right-sided sciatica     Problem List Patient Active Problem List   Diagnosis Date Noted  . Hemoperitoneum 08/04/2015  . Abdominal pain in female 08/04/2015  . S/P colonoscopy 08/04/2015  . GERD (gastroesophageal reflux disease)   . Gastroesophageal reflux disease with esophagitis     Ayesha Mohair, PT, DPT 01/03/2018, 11:16 AM  [facility] [address] [place], [place], [address] Phone: [phone]   Fax:  [phone]  Name: ANGELES ZEHNER MRN: [id number] Date of Birth: September 09, 1955

## 2018-01-07 ENCOUNTER — Ambulatory Visit: Payer: BLUE CROSS/BLUE SHIELD | Admitting: Physical Therapy

## 2018-01-09 ENCOUNTER — Ambulatory Visit: Payer: BLUE CROSS/BLUE SHIELD | Admitting: Physical Therapy

## 2018-01-09 ENCOUNTER — Encounter: Payer: Self-pay | Admitting: Physical Therapy

## 2018-01-09 DIAGNOSIS — M6281 Muscle weakness (generalized): Secondary | ICD-10-CM | POA: Diagnosis not present

## 2018-01-09 DIAGNOSIS — M545 Low back pain, unspecified: Secondary | ICD-10-CM

## 2018-01-09 DIAGNOSIS — G8929 Other chronic pain: Secondary | ICD-10-CM

## 2018-01-09 DIAGNOSIS — M5441 Lumbago with sciatica, right side: Secondary | ICD-10-CM

## 2018-01-09 DIAGNOSIS — M62830 Muscle spasm of back: Secondary | ICD-10-CM

## 2018-01-09 NOTE — Therapy (Signed)
[facility] [address] [place], [place], [address] Phone: [phone]   Fax:  [phone]  Physical Therapy Treatment  Patient Details  Name: Marcia Walker MRN: [id number] Date of Birth: 1955/12/14 Referring Provider (PT): Dr Norlene Campbell   Encounter Date: 01/09/2018  PT End of Session - 01/09/18 0816    Visit Number  6    Number of Visits  12    Date for PT Re-Evaluation  01/23/18    Authorization Type  BCBS    PT Start Time  0814   14 minutes late    PT Stop Time  0855    PT Time Calculation (min)  41 min       Past Medical History:  Diagnosis Date  . Felon of finger of right hand with lymphangitis 12/17/2011  . GERD (gastroesophageal reflux disease)   . Headache(784.0)    with allergies    Past Surgical History:  Procedure Laterality Date  . I&D EXTREMITY  12/17/2011   Procedure: IRRIGATION AND DEBRIDEMENT EXTREMITY;  Surgeon: Marlowe Shores, MD;  Location: [facility] OR;  Service: Orthopedics;  Laterality: Right;  Right Ring Finger  . left shouler manipulation  01/2010  . OTHER SURGICAL HISTORY  15 yrs ago   cyst removed from end of spine  . right shoulder manipulation 03/2011  03/2011  . UTERINE FIBROID SURGERY  2007    There were no vitals filed for this visit.  Subjective Assessment - 01/09/18 0815    Subjective  Back is doing really good except with sleeping, my right low back wakes me up.     Currently in Pain?  Yes    Pain Score  5     Pain Location  Back    Pain Orientation  Right                       [facility] Adult PT Treatment/Exercise - 01/09/18 0001      Lumbar Exercises: Stretches   Other Lumbar Stretch Exercise  QL stretch seated with rolling stool. Also in doorway and in sidelying.       Lumbar Exercises: Sidelying   Clam Limitations  [place] clam shell 10 x 2 in flexion and neutral       Lumbar Exercises: Prone   Other Prone Lumbar Exercises  Super man 5 sec  10 ,  prone hip extension with knee flexed x 10 each- when performed on left, caused increased right low back pain      Moist Heat Therapy   Number Minutes Moist Heat  10 Minutes    Moist Heat Location  Lumbar Spine   sidelying     Manual Therapy   Soft tissue mobilization  IASTM to right lower lumbar paraspinal/ QL               PT Short Term Goals - 01/03/18 1112      PT SHORT TERM GOAL #1   Title  Patient will be independent with HEP     Time  4    Period  Weeks    Status  Achieved        PT Long Term Goals - 01/03/18 1113      PT LONG TERM GOAL #1   Title  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     Baseline  33% limited     Time  6    Period  Weeks  Status  On-going      PT LONG TERM GOAL #2   Title  pt will demo strong and symetrical TA and multifidi contraction while performing her core work     Baseline  1 hour , 45 minutes without increased pain.     Time  6    Period  Weeks    Status  Achieved      PT LONG TERM GOAL #3   Title  Pt will improve R LE hip strength to 5/5 in order to improve functional mobilty and gait.     Baseline  Currently 4/5 ext, 4+/5 abd    Time  6    Period  Weeks    Status  On-going      PT LONG TERM GOAL #4   Title  demo cervical and lumbar ROM WNL without pain to allow her to get in positions for her job    Baseline  still some limitation of extension    Time  6    Period  Weeks    Status  On-going      PT LONG TERM GOAL #6   Title  perform her work activities with no more than 2/10 pain at the end of a 14 hr day that settles down with rest    Time  6    Period  Weeks    Status  On-going            Plan - 01/09/18 0910    Clinical Impression Statement  Pt 15 miniutes late. Pain 5/10 right low back during drive over. After Seated QL stretches with stool, pain reduced to 2/10. Tenderness noted in right lower lumbar paraspinal and treated with  IASTM. Instructed pt in prone lying with  pillow to reduce pain when trying to sleep.     PT Next Visit Plan  Pending pain level and response to previous treatment plan more focus exercise progression with previous stabilization exercises, further STM as needed    PT Home Exercise Plan  bridges, trunk rotation in hook lying, piriformis stretch, SLR, green band clam , supine bridge with green band clam , cervical rotation snags    Consulted and Agree with Plan of Care  Patient       Patient will benefit from skilled therapeutic intervention in order to improve the following deficits and impairments:  Pain, Increased muscle spasms, Decreased activity tolerance, Decreased range of motion, Decreased strength  Visit Diagnosis: Muscle weakness (generalized)  Chronic right-sided low back pain without sciatica  Chronic bilateral low back pain without sciatica  Muscle spasm of back  Acute right-sided low back pain with right-sided sciatica     Problem List Patient Active Problem List   Diagnosis Date Noted  . Hemoperitoneum 08/04/2015  . Abdominal pain in female 08/04/2015  . S/P colonoscopy 08/04/2015  . GERD (gastroesophageal reflux disease)   . Gastroesophageal reflux disease with esophagitis     Sherrie Mustache, PTA 01/09/2018, 9:13 AM  [facility] [address] [place], [place], [address] Phone: [phone]   Fax:  [phone]  Name: SARAN LAVIOLETTE MRN: [id number] Date of Birth: 12/07/1955

## 2018-01-14 ENCOUNTER — Ambulatory Visit: Payer: BLUE CROSS/BLUE SHIELD | Admitting: Physical Therapy

## 2018-01-14 ENCOUNTER — Encounter: Payer: Self-pay | Admitting: Physical Therapy

## 2018-01-14 DIAGNOSIS — M62830 Muscle spasm of back: Secondary | ICD-10-CM

## 2018-01-14 DIAGNOSIS — M6281 Muscle weakness (generalized): Secondary | ICD-10-CM

## 2018-01-14 DIAGNOSIS — M545 Low back pain: Secondary | ICD-10-CM

## 2018-01-14 DIAGNOSIS — M5441 Lumbago with sciatica, right side: Secondary | ICD-10-CM

## 2018-01-14 DIAGNOSIS — G8929 Other chronic pain: Secondary | ICD-10-CM

## 2018-01-14 NOTE — Therapy (Signed)
[facility] [address] [place], [place], [address] Phone: [phone]   Fax:  [phone]  Physical Therapy Treatment  Patient Details  Name: Marcia Walker MRN: [id number] Date of Birth: 1955-06-06 Referring Provider (PT): Dr Norlene Campbell   Encounter Date: 01/14/2018  PT End of Session - 01/14/18 1244    Visit Number  7    Number of Visits  12    Date for PT Re-Evaluation  01/23/18    Authorization Type  BCBS    PT Start Time  0800    PT Stop Time  0855    PT Time Calculation (min)  55 min       Past Medical History:  Diagnosis Date  . Felon of finger of right hand with lymphangitis 12/17/2011  . GERD (gastroesophageal reflux disease)   . Headache(784.0)    with allergies    Past Surgical History:  Procedure Laterality Date  . I&D EXTREMITY  12/17/2011   Procedure: IRRIGATION AND DEBRIDEMENT EXTREMITY;  Surgeon: Marlowe Shores, MD;  Location: [facility] OR;  Service: Orthopedics;  Laterality: Right;  Right Ring Finger  . left shouler manipulation  01/2010  . OTHER SURGICAL HISTORY  15 yrs ago   cyst removed from end of spine  . right shoulder manipulation 03/2011  03/2011  . UTERINE FIBROID SURGERY  2007    There were no vitals filed for this visit.  Subjective Assessment - 01/14/18 0849    Subjective  I had pain in my Right low back after last treatment. Gone the next day,     Currently in Pain?  No/denies    Pain Score  0-No pain   took pain meds before coming   Pain Location  Back    Pain Orientation  Right    Pain Descriptors / Indicators  Aching    Aggravating Factors   sitting     Pain Relieving Factors  exercises, good body mechanics                        [facility] Adult PT Treatment/Exercise - 01/14/18 0001      Lumbar Exercises: Stretches   Other Lumbar Stretch Exercise  QL stretch seated with physioball       Lumbar Exercises: Standing   Other Standing Lumbar Exercises  rotation  stab with one red band pulling into horizontal adduction narrow stance and staggered stance, then palloff press with double red band, reduced to one red band       Lumbar Exercises: Seated   Other Seated Lumbar Exercises  on green physioball seated pelvic roocking, circles, and stabilization with alternating UE/LE raises       Moist Heat Therapy   Number Minutes Moist Heat  10 Minutes    Moist Heat Location  Lumbar Spine   supine              PT Short Term Goals - 01/03/18 1112      PT SHORT TERM GOAL #1   Title  Patient will be independent with HEP     Time  4    Period  Weeks    Status  Achieved        PT Long Term Goals - 01/14/18 1248      PT LONG TERM GOAL #1   Title  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     Baseline  53% limited at eval  Time  6    Period  Weeks    Status  On-going      PT LONG TERM GOAL #2   Title  pt will demo strong and symetrical TA and multifidi contraction while performing her core work     Baseline  progressing    Time  6    Period  Weeks    Status  Achieved      PT LONG TERM GOAL #3   Title  Pt will improve R LE hip strength to 5/5 in order to improve functional mobilty and gait.     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 Pain resolved the next day. She continues with pain with sitting. She notes improved stability and ability to lift case in and out of back of car. Challenged sitting and standing core stability. Increased right LBP with rotational stability exercsies, improved with decreased resitance/challenge. HMP at end of session.     PT Next Visit Plan  Pending pain level and response to previous treatment plan more focus exercise progression with previous stabilization exercises, further STM as needed    PT Home Exercise Plan  bridges, trunk rotation in hook lying, piriformis stretch, SLR, green band clam , supine bridge with green band clam , cervical rotation snags    Consulted and Agree with Plan of Care  Patient       Patient will benefit from skilled therapeutic intervention in order to improve the following deficits and impairments:  Pain, Increased muscle spasms, Decreased activity tolerance, Decreased range of motion, Decreased strength  Visit Diagnosis: Muscle weakness (generalized)  Chronic right-sided low back pain without sciatica  Chronic bilateral low back pain without sciatica  Muscle spasm of back  Acute right-sided low back pain with right-sided sciatica     Problem List Patient Active Problem List   Diagnosis Date Noted  . Hemoperitoneum 08/04/2015  . Abdominal pain in female 08/04/2015  . S/P colonoscopy 08/04/2015  . GERD (gastroesophageal reflux disease)   . Gastroesophageal reflux disease with esophagitis     Sherrie Mustache, PTA 01/14/2018, 12:51 PM  [facility] [address] [place], [place], [address] Phone: [phone]   Fax:  [phone]  Name: NARAYA STONEBERG MRN: [id number] Date of Birth: Sep 28, 1955

## 2018-01-17 ENCOUNTER — Ambulatory Visit: Payer: BLUE CROSS/BLUE SHIELD | Admitting: Physical Therapy

## 2018-01-22 ENCOUNTER — Encounter: Payer: Self-pay | Admitting: Physical Therapy

## 2018-01-22 ENCOUNTER — Ambulatory Visit: Payer: BLUE CROSS/BLUE SHIELD | Admitting: Physical Therapy

## 2018-01-22 DIAGNOSIS — M6281 Muscle weakness (generalized): Secondary | ICD-10-CM | POA: Diagnosis not present

## 2018-01-22 DIAGNOSIS — M545 Low back pain: Secondary | ICD-10-CM

## 2018-01-22 DIAGNOSIS — G8929 Other chronic pain: Secondary | ICD-10-CM

## 2018-01-22 DIAGNOSIS — M62830 Muscle spasm of back: Secondary | ICD-10-CM

## 2018-01-22 DIAGNOSIS — M5441 Lumbago with sciatica, right side: Secondary | ICD-10-CM

## 2018-01-23 ENCOUNTER — Encounter: Payer: Self-pay | Admitting: Physical Therapy

## 2018-01-23 NOTE — Therapy (Signed)
[facility] [address] [place], [place], [address] Phone: [phone]   Fax:  [phone]  Physical Therapy Treatment/ Recert   Patient Details  Name: Marcia Walker MRN: [id number] Date of Birth: October 14, 1955 Referring Provider (PT): Dr Norlene Campbell   Encounter Date: 01/22/2018  PT End of Session - 01/22/18 1542    Visit Number  8    Number of Visits  16    Date for PT Re-Evaluation  02/20/18    Authorization Type  BCBS    PT Start Time  0848    PT Stop Time  0940    PT Time Calculation (min)  52 min    Activity Tolerance  Patient tolerated treatment well    Behavior During Therapy  [facility] for tasks assessed/performed       Past Medical History:  Diagnosis Date  . Felon of finger of right hand with lymphangitis 12/17/2011  . GERD (gastroesophageal reflux disease)   . Headache(784.0)    with allergies    Past Surgical History:  Procedure Laterality Date  . I&D EXTREMITY  12/17/2011   Procedure: IRRIGATION AND DEBRIDEMENT EXTREMITY;  Surgeon: Marlowe Shores, MD;  Location: [facility] OR;  Service: Orthopedics;  Laterality: Right;  Right Ring Finger  . left shouler manipulation  01/2010  . OTHER SURGICAL HISTORY  15 yrs ago   cyst removed from end of spine  . right shoulder manipulation 03/2011  03/2011  . UTERINE FIBROID SURGERY  2007    There were no vitals filed for this visit.  Subjective Assessment - 01/22/18 0853    Subjective  Patient has had a significant amount of pain over the past day or two. She has been working long days and standing. She did pretty well until the last day. She had soem neck pain on Saturday.     Pertinent History  L3-4 lateral foraminal protrusion, L3-4 nerve root irritation    Limitations  Sitting;House hold activities;Lifting    How long can you sit comfortably?  up to 30'    How long can you stand comfortably?  unlimited    How long can you walk comfortably?  unlimited    Patient  Stated Goals  reduce pain and be able to use her back/body to return to her PLOF with work and lifting    Pain Score  6     Pain Location  Back    Pain Orientation  Right    Pain Descriptors / Indicators  Aching    Pain Type  Chronic pain    Pain Onset  More than a month ago    Pain Frequency  Constant    Aggravating Factors   sitting     Pain Relieving Factors  exercises, good body mecanics     Multiple Pain Sites  No    Pain Orientation  Right    Pain Descriptors / Indicators  Aching    Pain Type  Acute pain    Pain Onset  In the past 7 days    Pain Frequency  Intermittent                       [facility] Adult PT Treatment/Exercise - 01/23/18 0001      Lumbar Exercises: Stretches   Single Knee to Chest Stretch  2 reps;30 seconds;Right;Left    Piriformis Stretch  30 seconds;Right;2 reps;Left    Other Lumbar Stretch Exercise  Prayer stretch 2x20 seconds; lateral prayer stretch  2x10 sec       Lumbar Exercises: Prone   Other Prone Lumbar Exercises  Prone pelvic press x10; Pelvic press with Knee flexion x10 each leg       Modalities   Modalities  Cryotherapy      Cryotherapy   Number Minutes Cryotherapy  10 Minutes    Cryotherapy Location  Lumbar Spine    Type of Cryotherapy  Ice pack      Manual Therapy   Manual therapy comments  LAD to right lower extremity     Soft tissue mobilization  trigger point release to glut medius and quaratus;             PT Education - 01/22/18 1542    Education provided  Yes    Education Details  reviewed POC going foward     Person(s) Educated  Patient    Methods  Explanation;Demonstration;Tactile cues;Verbal cues    Comprehension  Verbalized understanding;Returned demonstration;Verbal cues required;Tactile cues required       PT Short Term Goals - 01/22/18 1547      PT SHORT TERM GOAL #1   Title  Patient will be independent with HEP     Time  4    Period  Weeks        PT Long Term Goals - 01/22/18 1547       PT LONG TERM GOAL #1   Title  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     Baseline  not measured     Time  6    Period  Weeks    Status  On-going      PT LONG TERM GOAL #2   Title  pt will demo strong and symetrical TA and multifidi contraction while performing her core work     Baseline  feels like it is progressing     Time  6    Period  Weeks    Status  Achieved      PT LONG TERM GOAL #3   Title  Pt will improve R LE hip strength to 5/5 in order to improve functional mobilty and gait.     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 She was advised to use her stretches and exercises to decrease her own symptoms. If she is able to do this in the next few days she is likley close to discharge. She Had less pain after treatment today. Her strength and lumbar pmotion has improved. she reports she was able to do her job for several days before her pain started. therapy reviewed prayer stretching and hip stretching today. Therapy will continue 2w4. She may not need all of these visits.     Clinical Presentation   Stable    Clinical Decision Making  Low    Rehab Potential  Good    PT Frequency  2x / week    PT Duration  6 weeks    PT Treatment/Interventions  Electrical Stimulation;Cryotherapy;Moist Heat;Traction;Ultrasound;Gait training;Stair training;Functional mobility training;Therapeutic activities;Therapeutic exercise;Manual techniques;Taping;Dry needling;Passive range of motion;Patient/family education;Neuromuscular re-education    PT Next Visit Plan  Pending pain level and response to previous treatment plan more focus exercise progression with previous stabilization exercises, further STM as needed; continue soft tissue mobilization; continue core strengthening     PT Home Exercise Plan  bridges, trunk rotation in hook lying, piriformis stretch, SLR, green band clam , supine bridge with green band clam , cervical rotation snags    Consulted and Agree with Plan of Care  Patient       Patient will benefit from skilled therapeutic intervention in order to improve the following deficits and impairments:  Pain, Increased muscle spasms, Decreased activity tolerance, Decreased range of motion, Decreased strength  Visit Diagnosis: Muscle weakness (generalized)  Chronic right-sided low back pain without sciatica  Chronic bilateral low back pain without sciatica  Muscle spasm of back  Acute right-sided low back pain with right-sided sciatica     Problem List Patient Active Problem List   Diagnosis Date Noted  . Hemoperitoneum 08/04/2015  . Abdominal pain in female 08/04/2015  . S/P colonoscopy 08/04/2015  . GERD (gastroesophageal reflux disease)   . Gastroesophageal reflux disease with esophagitis     Dessie Coma  PT DPT  01/23/2018, 8:23 AM   Donzetta Kohut SPT  01/23/2018   [facility] [address] [place], [place], [address] Phone: [phone]   Fax:  [phone]  Name: Marcia Walker MRN: [id number] Date of Birth:  1955-07-14

## 2018-01-24 ENCOUNTER — Ambulatory Visit: Payer: BLUE CROSS/BLUE SHIELD | Admitting: Physical Therapy

## 2018-01-24 ENCOUNTER — Encounter: Payer: Self-pay | Admitting: Physical Therapy

## 2018-01-24 DIAGNOSIS — M5441 Lumbago with sciatica, right side: Secondary | ICD-10-CM

## 2018-01-24 DIAGNOSIS — M6281 Muscle weakness (generalized): Secondary | ICD-10-CM | POA: Diagnosis not present

## 2018-01-24 DIAGNOSIS — G8929 Other chronic pain: Secondary | ICD-10-CM

## 2018-01-24 DIAGNOSIS — M545 Low back pain, unspecified: Secondary | ICD-10-CM

## 2018-01-24 DIAGNOSIS — M62830 Muscle spasm of back: Secondary | ICD-10-CM

## 2018-01-24 NOTE — Therapy (Signed)
[facility] [address] [place], [place], [address] Phone: [phone]   Fax:  [phone]  Physical Therapy Treatment  Patient Details  Name: Marcia Walker MRN: [id number] Date of Birth: 1955/12/27 Referring Provider (PT): Dr Norlene Campbell   Encounter Date: 01/24/2018  PT End of Session - 01/24/18 0805    Visit Number  9    Number of Visits  16    Date for PT Re-Evaluation  02/20/18    Authorization Type  BCBS    PT Start Time  0845    PT Stop Time  0930    PT Time Calculation (min)  45 min       Past Medical History:  Diagnosis Date  . Felon of finger of right hand with lymphangitis 12/17/2011  . GERD (gastroesophageal reflux disease)   . Headache(784.0)    with allergies    Past Surgical History:  Procedure Laterality Date  . I&D EXTREMITY  12/17/2011   Procedure: IRRIGATION AND DEBRIDEMENT EXTREMITY;  Surgeon: Marlowe Shores, MD;  [facility]: [facility] OR;  Service: Orthopedics;  Laterality: Right;  Right Ring Finger  . left shouler manipulation  01/2010  . OTHER SURGICAL HISTORY  15 yrs ago   cyst removed from end of spine  . right shoulder manipulation 03/2011  03/2011  . UTERINE FIBROID SURGERY  2007    There were no vitals filed for this visit.  Subjective Assessment - 01/24/18 0803    Subjective  4 hours sitting yesterday for paperwork caused increased pain.     Currently in Pain?  Yes    Pain Score  5     Pain Location  Back    Pain Orientation  Mid    Pain Descriptors / Indicators  Aching    Pain Type  Chronic pain                       [facility] Adult PT Treatment/Exercise - 01/24/18 0001      Self-Care   Other Self-Care Comments   Sleep position on wedge instead of elevating only head, using small bolster under knees to decrease lumbar lordosis, Use of pain management strategies during exacerbations, Pilates Mat class, continue Aerobic exercise and frequent massage, continue with good  body mechanics, do not add weight with gym exercises, try to only increased reps, discontinue shoulder shrugs due to h/o neck pain.       Lumbar Exercises: Stretches   Double Knee to Chest Stretch  1 rep;60 seconds    Piriformis Stretch  30 seconds;Right;2 reps;Left    Figure 4 Stretch  3 reps;30 seconds    Other Lumbar Stretch Exercise  --      Lumbar Exercises: Standing   Other Standing Lumbar Exercises  rotation stab with one red band pulling into horizontal adduction narrow stance and staggered stance       Lumbar Exercises: Supine   Bridge  20 reps      Lumbar Exercises: Quadruped   [place]/Old Horse  10 reps               PT Short Term Goals - 01/22/18 1547      PT SHORT TERM GOAL #1   Title  Patient will be independent with HEP     Time  4    Period  Weeks        PT Long Term Goals - 01/22/18 1547      PT LONG TERM GOAL #  1   Title  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     Baseline  not measured     Time  6    Period  Weeks    Status  On-going      PT LONG TERM GOAL #2   Title  pt will demo strong and symetrical TA and multifidi contraction while performing her core work     Baseline  feels like it is progressing     Time  6    Period  Weeks    Status  Achieved      PT LONG TERM GOAL #3   Title  Pt will improve R LE hip strength to 5/5 in order to improve functional mobilty and gait.     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 She attempted walking and pelvic tilts during the sitting which only helped somewhat. She has a 6 hour drive today and plans to use her TENS unit. Pt reports pain with working has decreased on average to 2/10 or less however pain does increased with more than 4 consecutive work days and can increase to 7-8/10 pain. 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 Reinforced her pain management strategies including stretching/Heat/ICe/Tens after pain exacerbation and only gentle painfree core and to continue with lifting modifications and proper body mechanics. Pt interested in Pilates Mat Class offered through our clinic. Ansered questions about sleep positioning and reviewed standing rotaionsl stabilization exercises. She wil likely discharge next visit.     PT Next Visit Plan  Pending pain level and response to previous treatment plan more focus exercise progression with previous stabilization exercises, further STM as needed; continue soft tissue mobilization; continue core strengthening     PT Home Exercise Plan  bridges, trunk rotation in hook lying, piriformis stretch, SLR, green band clam , supine bridge with green band clam , cervical rotation snags       Patient will benefit from skilled therapeutic intervention in order to improve the following deficits and impairments:  Pain, Increased muscle spasms, Decreased activity tolerance, Decreased range of motion, Decreased strength  Visit Diagnosis: Muscle weakness (  generalized)  Chronic right-sided low back pain without sciatica  Chronic bilateral low back pain without sciatica  Muscle spasm  of back  Acute right-sided low back pain with right-sided sciatica     Problem List Patient Active Problem List   Diagnosis Date Noted  . Hemoperitoneum 08/04/2015  . Abdominal pain in female 08/04/2015  . S/P colonoscopy 08/04/2015  . GERD (gastroesophageal reflux disease)   . Gastroesophageal reflux disease with esophagitis     Sherrie Mustache, PTA 01/24/2018, 9:02 AM  [facility] [address] [place], [place], [address] Phone: [phone]   Fax:  [phone]  Name: Marcia Walker MRN: [id number] Date of Birth: 08-May-1955

## 2018-01-28 ENCOUNTER — Encounter: Payer: BLUE CROSS/BLUE SHIELD | Admitting: Physical Therapy

## 2018-01-30 ENCOUNTER — Ambulatory Visit: Payer: BLUE CROSS/BLUE SHIELD | Admitting: Physical Therapy

## 2018-01-30 ENCOUNTER — Encounter: Payer: Self-pay | Admitting: Physical Therapy

## 2018-01-30 DIAGNOSIS — M545 Low back pain: Secondary | ICD-10-CM

## 2018-01-30 DIAGNOSIS — M6281 Muscle weakness (generalized): Secondary | ICD-10-CM

## 2018-01-30 DIAGNOSIS — M62830 Muscle spasm of back: Secondary | ICD-10-CM

## 2018-01-30 DIAGNOSIS — G8929 Other chronic pain: Secondary | ICD-10-CM

## 2018-01-30 NOTE — Therapy (Signed)
[facility] [place], [place], [address] Phone: [phone]   Fax:  [phone]  Physical Therapy Treatment/ Discharge   Patient Details  Name: Marcia Walker MRN: [id number] Date of Birth: 06-May-1955 Referring Provider (PT): Dr Joni Fears   Encounter Date: 01/30/2018  PT End of Session - 01/30/18 1156    Visit Number  10    Number of Visits  16    Date for PT Re-Evaluation  02/20/18    Authorization Type  BCBS    PT Start Time  1114   Patient 14 minutes late to her appointment    PT Stop Time  1145    PT Time Calculation (min)  31 min    Activity Tolerance  Patient tolerated treatment well    Behavior During Therapy  [facility] for tasks assessed/performed       Past Medical History:  Diagnosis Date  . Felon of finger of right hand with lymphangitis 12/17/2011  . GERD (gastroesophageal reflux disease)   . Headache(784.0)    with allergies    Past Surgical History:  Procedure Laterality Date  . I&D EXTREMITY  12/17/2011   Procedure: IRRIGATION AND DEBRIDEMENT EXTREMITY;  Surgeon: Schuyler Amor, MD;  Location: [facility];  Service: Orthopedics;  Laterality: Right;  Right Ring Finger  . left shouler manipulation  01/2010  . OTHER SURGICAL HISTORY  15 yrs ago   cyst removed from end of spine  . right shoulder manipulation 03/2011  03/2011  . UTERINE FIBROID SURGERY  2007    There were no vitals filed for this visit.  Subjective Assessment - 01/30/18 1116    Subjective  Patient reports she had to lift her case this morning and had a little pull. Overall her pain has been manageable. Some days she has pain but she is able to use her stretches and exercises to get her pain down.     Pertinent History  L3-4 lateral foraminal protrusion, L3-4 nerve root irritation    Limitations  Sitting;House hold activities;Lifting                       [facility] Adult PT Treatment/Exercise - 01/30/18 0001      Self-Care   ADL's  reviewedproper lifting tehcnique with her box; Reviewed ,most beneficial standing position    Other Self-Care Comments   reviewed POC going forward.; self soft tiisue mobilization       Manual Therapy   Manual therapy comments  LAD to right lower extremity     Soft tissue mobilization  trigger point release to glut medius and quaratus;               PT Short Term Goals - 01/22/18 1547      PT SHORT TERM GOAL #1   Title  Patient will be independent with HEP     Time  4    Period  Weeks        PT Long Term Goals - 01/30/18 1203      PT LONG TERM GOAL #1   Title  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     Baseline  33 % limitation     Time  6    Period  Weeks    Status  Achieved      PT LONG TERM GOAL #2   Title  pt will demo strong and symetrical TA and multifidi contraction while  performing her core work     Baseline  can perfrom without cuing     Time  6    Period  Weeks    Status  Achieved      PT LONG TERM GOAL #3   Title  Pt will improve R LE hip strength to 5/5 in order to improve functional mobilty and gait.     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 She also has pain if she drivs long sdistances. She is able to get her pain under control though with her stretches and exercises. Therapy reviewed her POC going forward. Therapy also reviewed self soft tissue mobilization using her ball and a thera-cane. She has met or partially met all her goals for therapy. D/C to HEP at this time     Clinical Presentation  Stable    Clinical Decision Making  Low    Rehab Potential  Good    PT Frequency  2x / week    PT Duration  6 weeks    PT Treatment/Interventions  Electrical Stimulation;Cryotherapy;Moist Heat;Traction;Ultrasound;Gait training;Stair training;Functional mobility training;Therapeutic activities;Therapeutic exercise;Manual techniques;Taping;Dry needling;Passive range of motion;Patient/family education;Neuromuscular re-education    PT Next Visit Plan  Pending pain level and response to previous treatment plan more focus exercise progression with previous stabilization exercises, further STM as needed; continue soft tissue mobilization; continue core strengthening     PT Home Exercise Plan  bridges, trunk rotation in hook lying, piriformis stretch, SLR, green band clam , supine bridge with green band clam , cervical rotation snags    Consulted and Agree with Plan of Care  Patient       Patient will benefit from skilled therapeutic intervention in order to improve the following deficits and impairments:  Pain, Increased muscle spasms, Decreased activity tolerance, Decreased range of motion, Decreased strength  Visit Diagnosis: Muscle weakness (generalized)  Chronic right-sided low back pain without sciatica  Muscle spasm of back    PHYSICAL THERAPY DISCHARGE SUMMARY  Visits from  Start of Care: 10  Current functional level related to goals /  functional outcomes: Patient is able to use her stretches and exercises to manage her pain    Remaining deficits: 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  Patient goals were met. Patient is being discharged due to being pleased with the current functional level.  ?????      Problem List Patient Active Problem List   Diagnosis Date Noted  . Hemoperitoneum 08/04/2015  . Abdominal pain in female 08/04/2015  . S/P colonoscopy 08/04/2015  . GERD (gastroesophageal reflux disease)   . Gastroesophageal reflux disease with esophagitis     Carney Living PT DPT  01/30/2018, 12:09 PM   Einar Crow SPT  01/30/2018   During this treatment session, the therapist was present, participating in and directing the treatment.   [facility] [place], [place], [address] Phone: [phone]   Fax:  [phone]  Name: VELORA HORSTMAN MRN: [id number] Date of Birth: [birth year]

## 2018-02-18 ENCOUNTER — Other Ambulatory Visit: Payer: Self-pay | Admitting: Family Medicine

## 2018-02-18 ENCOUNTER — Ambulatory Visit
Admission: RE | Admit: 2018-02-18 | Discharge: 2018-02-18 | Disposition: A | Discharge status: Home / Self Care | Payer: BLUE CROSS/BLUE SHIELD | Source: Ambulatory Visit | Attending: Family Medicine | Admitting: Family Medicine

## 2018-02-18 DIAGNOSIS — Z1231 Encounter for screening mammogram for malignant neoplasm of breast: Secondary | ICD-10-CM

## 2018-02-20 ENCOUNTER — Other Ambulatory Visit: Payer: Self-pay | Admitting: Family Medicine

## 2018-02-20 DIAGNOSIS — R928 Other abnormal and inconclusive findings on diagnostic imaging of breast: Secondary | ICD-10-CM

## 2018-02-26 ENCOUNTER — Ambulatory Visit
Admission: RE | Admit: 2018-02-26 | Discharge: 2018-02-26 | Disposition: A | Discharge status: Home / Self Care | Payer: BLUE CROSS/BLUE SHIELD | Source: Ambulatory Visit | Attending: Family Medicine | Admitting: Family Medicine

## 2018-02-26 ENCOUNTER — Ambulatory Visit: Payer: BLUE CROSS/BLUE SHIELD

## 2018-02-26 DIAGNOSIS — R928 Other abnormal and inconclusive findings on diagnostic imaging of breast: Secondary | ICD-10-CM

## 2018-05-28 ENCOUNTER — Telehealth (INDEPENDENT_AMBULATORY_CARE_PROVIDER_SITE_OTHER): Payer: Self-pay | Admitting: Orthopaedic Surgery

## 2018-05-28 NOTE — Telephone Encounter (Signed)
Patient picked up medium lumbar support brace, Milana Na notified for billing, charged in charge capture under previous visit with today's date.

## 2018-05-28 NOTE — Telephone Encounter (Signed)
Patient called stating she lost her back brace belt that Dr. Cleophas Dunker gave her.  Patient states she is going out of town tomorrow at 1:00 pm and is requesting a return call ASAP to let her know if she can come by the office and pick another one up.  Please advise

## 2018-06-12 ENCOUNTER — Encounter (INDEPENDENT_AMBULATORY_CARE_PROVIDER_SITE_OTHER): Payer: Self-pay

## 2018-06-12 ENCOUNTER — Encounter (INDEPENDENT_AMBULATORY_CARE_PROVIDER_SITE_OTHER): Payer: Self-pay | Admitting: Orthopaedic Surgery

## 2018-06-12 ENCOUNTER — Ambulatory Visit (INDEPENDENT_AMBULATORY_CARE_PROVIDER_SITE_OTHER): Payer: BLUE CROSS/BLUE SHIELD | Admitting: Orthopaedic Surgery

## 2018-06-12 ENCOUNTER — Other Ambulatory Visit: Payer: Self-pay

## 2018-06-12 VITALS — BP 125/70 | HR 92 | Ht 62.0 in | Wt 112.0 lb

## 2018-06-12 DIAGNOSIS — G8929 Other chronic pain: Secondary | ICD-10-CM

## 2018-06-12 DIAGNOSIS — M5441 Lumbago with sciatica, right side: Secondary | ICD-10-CM | POA: Diagnosis not present
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
## 2018-06-13 MED ORDER — DICLOFENAC SODIUM 1 % TD GEL: 4.0000 g | Freq: Three times a day (TID) | TRANSDERMAL | 4 refills | Status: AC | PRN

## 2018-06-13 NOTE — Addendum Note (Signed)
Addended by: Wendi Maya on: 06/13/2018 02:20 PM   Modules accepted: Orders

## 2018-06-13 NOTE — Addendum Note (Signed)
Addended by: Wendi Maya on: 06/13/2018 09:54 AM   Modules accepted: Orders

## 2019-02-09 ENCOUNTER — Telehealth: Payer: Self-pay | Admitting: Orthopaedic Surgery
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
## 2019-02-10 ENCOUNTER — Other Ambulatory Visit: Payer: Self-pay | Admitting: Orthopaedic Surgery

## 2019-02-10 DIAGNOSIS — G8929 Other chronic pain: Secondary | ICD-10-CM

## 2019-02-10 DIAGNOSIS — M5441 Lumbago with sciatica, right side: Secondary | ICD-10-CM

## 2019-02-10 NOTE — Telephone Encounter (Signed)
Called patient. No answer. Left message that I was going to place an order for MRI of her lower back. We need to see her back following the MRI.

## 2019-02-10 NOTE — Telephone Encounter (Signed)
Needs repeat MRI of lumbar spine

## 2019-02-10 NOTE — Telephone Encounter (Signed)
See below

## 2019-09-09 IMAGING — MG DIGITAL SCREENING BILATERAL MAMMOGRAM WITH CAD
4 series · 4 of 4 positions shown · non-contrast
Comparison: Previous exam(s).

CLINICAL DATA: Screening.

EXAM:
DIGITAL SCREENING BILATERAL MAMMOGRAM WITH CAD

[R MLO]
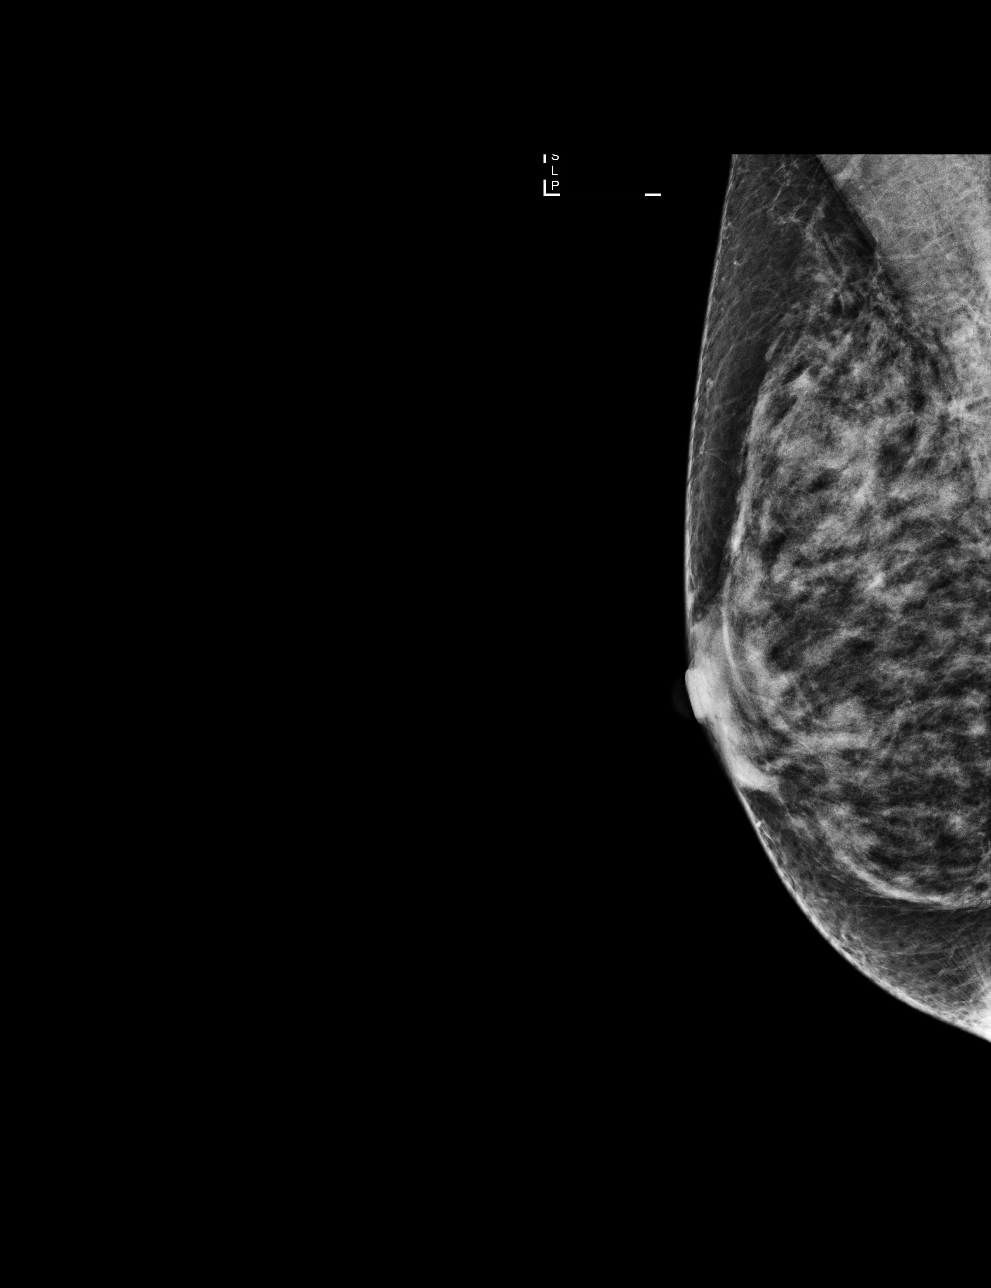

[L CC]
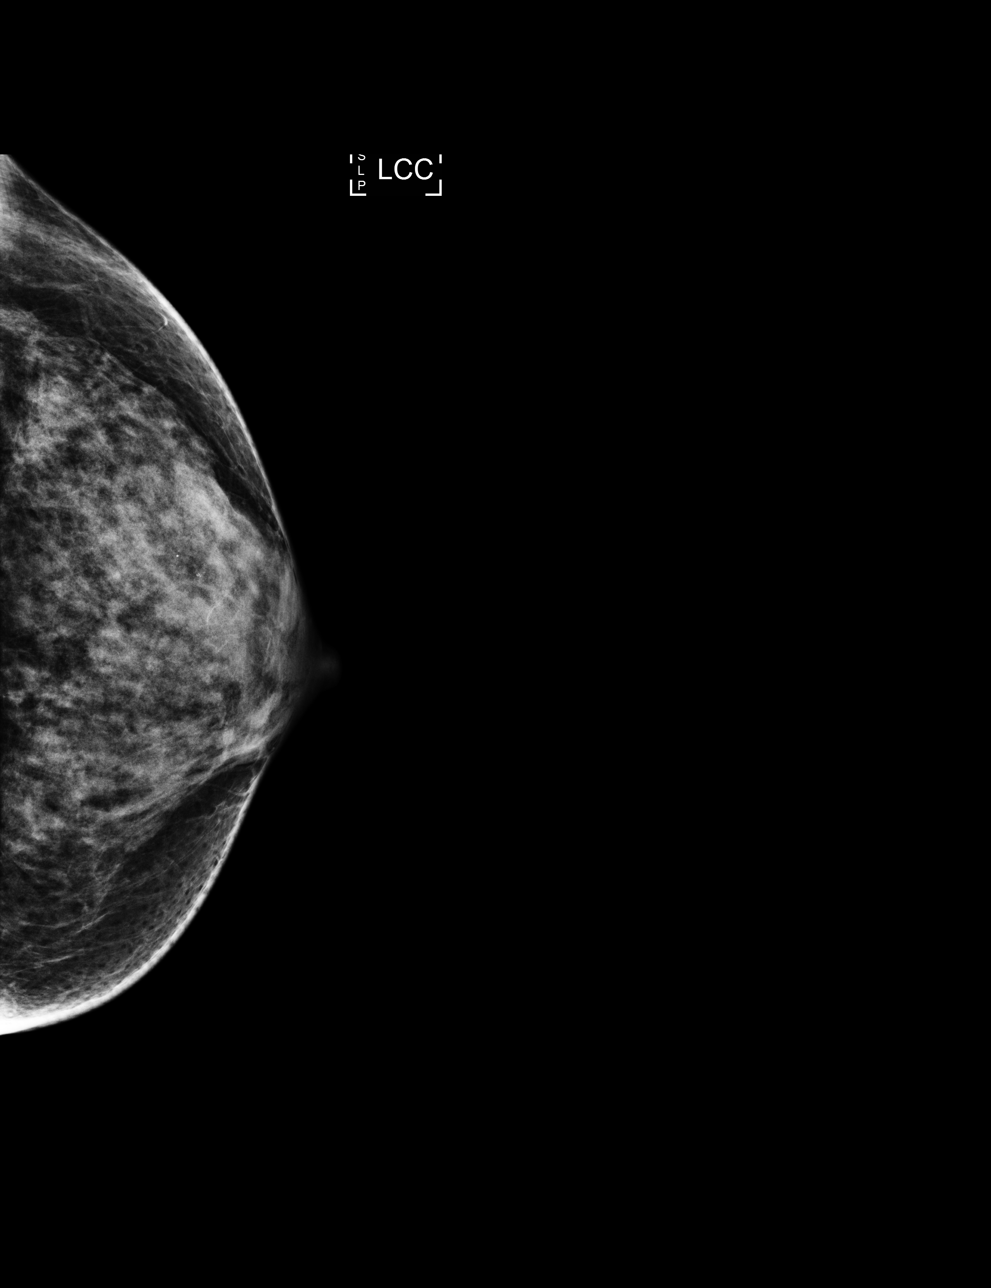

[R CC]
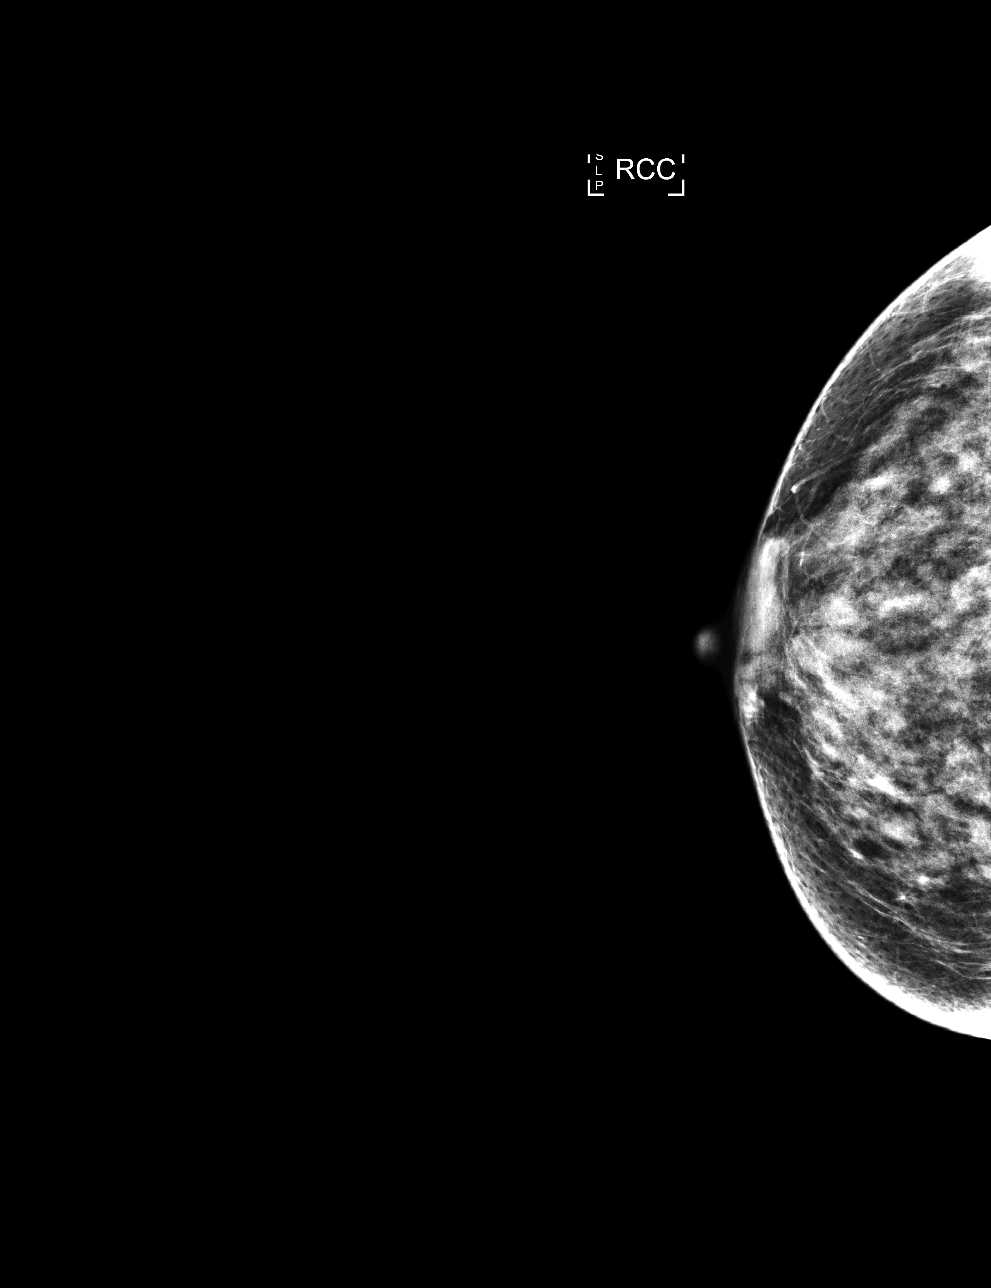

[L MLO]
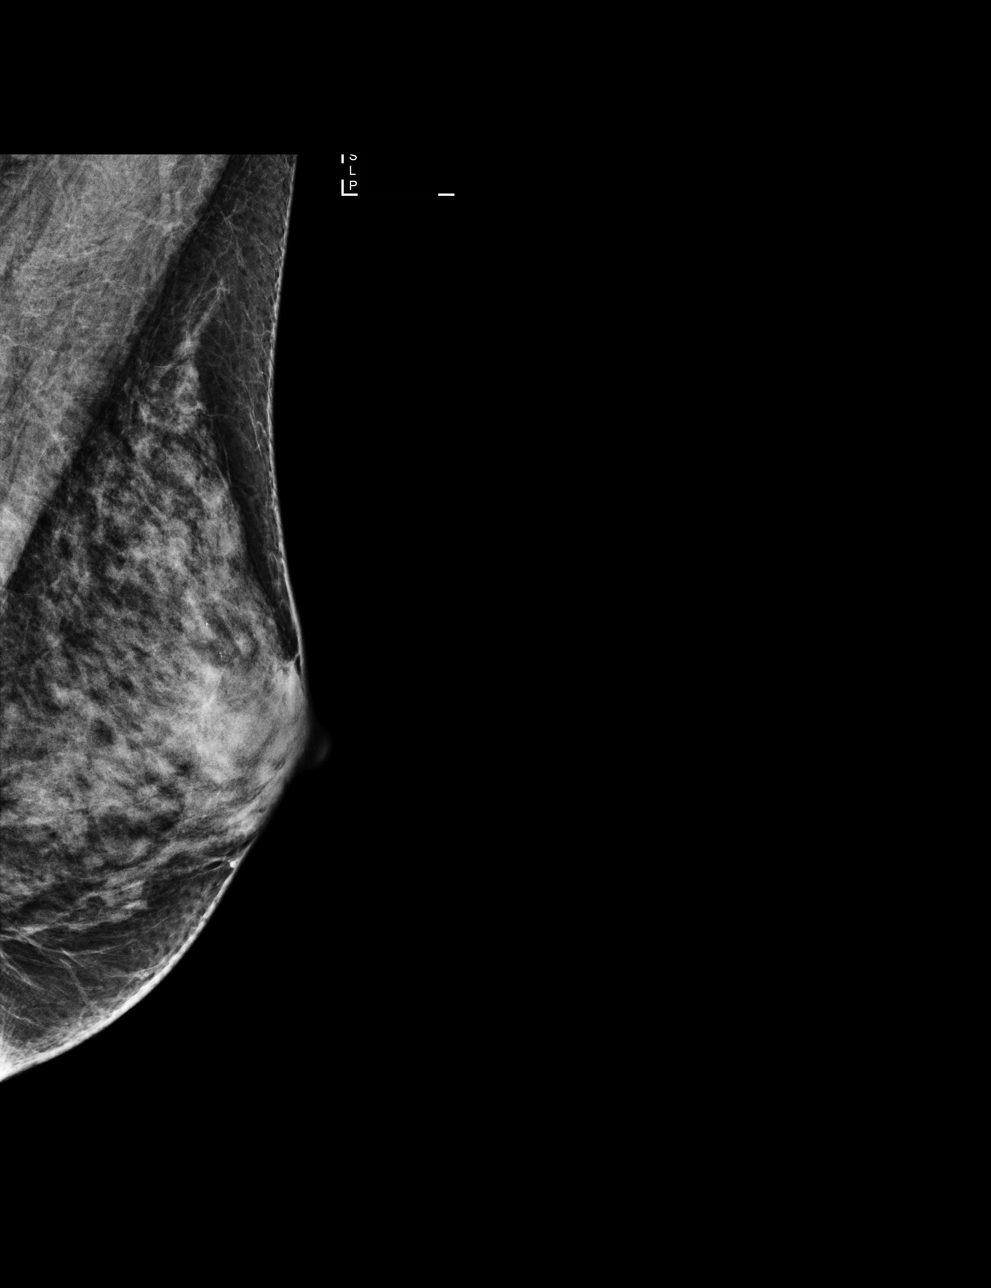

[4 of 4 positions shown; findings below may reference images not displayed]

ACR Breast Density Category c: The breast tissue is heterogeneously
dense, which may obscure small masses.
FINDINGS: In the right breast, a possible asymmetry warrants further
evaluation. In the left breast, no findings suspicious for
malignancy. Images were processed with CAD.
IMPRESSION: Further evaluation is suggested for possible asymmetry in the right
breast.

RECOMMENDATION:
Diagnostic mammogram and possibly ultrasound of the right breast.
(Code:[id number]-I-77H)

The patient will be contacted regarding the findings, and additional
imaging will be scheduled.

BI-RADS CATEGORY  0: Incomplete. Need additional imaging evaluation
and/or prior mammograms for comparison.

## 2019-09-17 IMAGING — MG DIGITAL DIAGNOSTIC UNILATERAL RIGHT MAMMOGRAM WITH TOMO AND CAD
4 series · 4 of 12 positions shown · non-contrast
Comparison: Previous exam(s).

CLINICAL DATA: Patient was called back from screening mammogram for
a possible asymmetry in the right breast.

EXAM:
DIGITAL DIAGNOSTIC UNILATERAL RIGHT MAMMOGRAM WITH CAD AND TOMO

[R CC synth-2D]
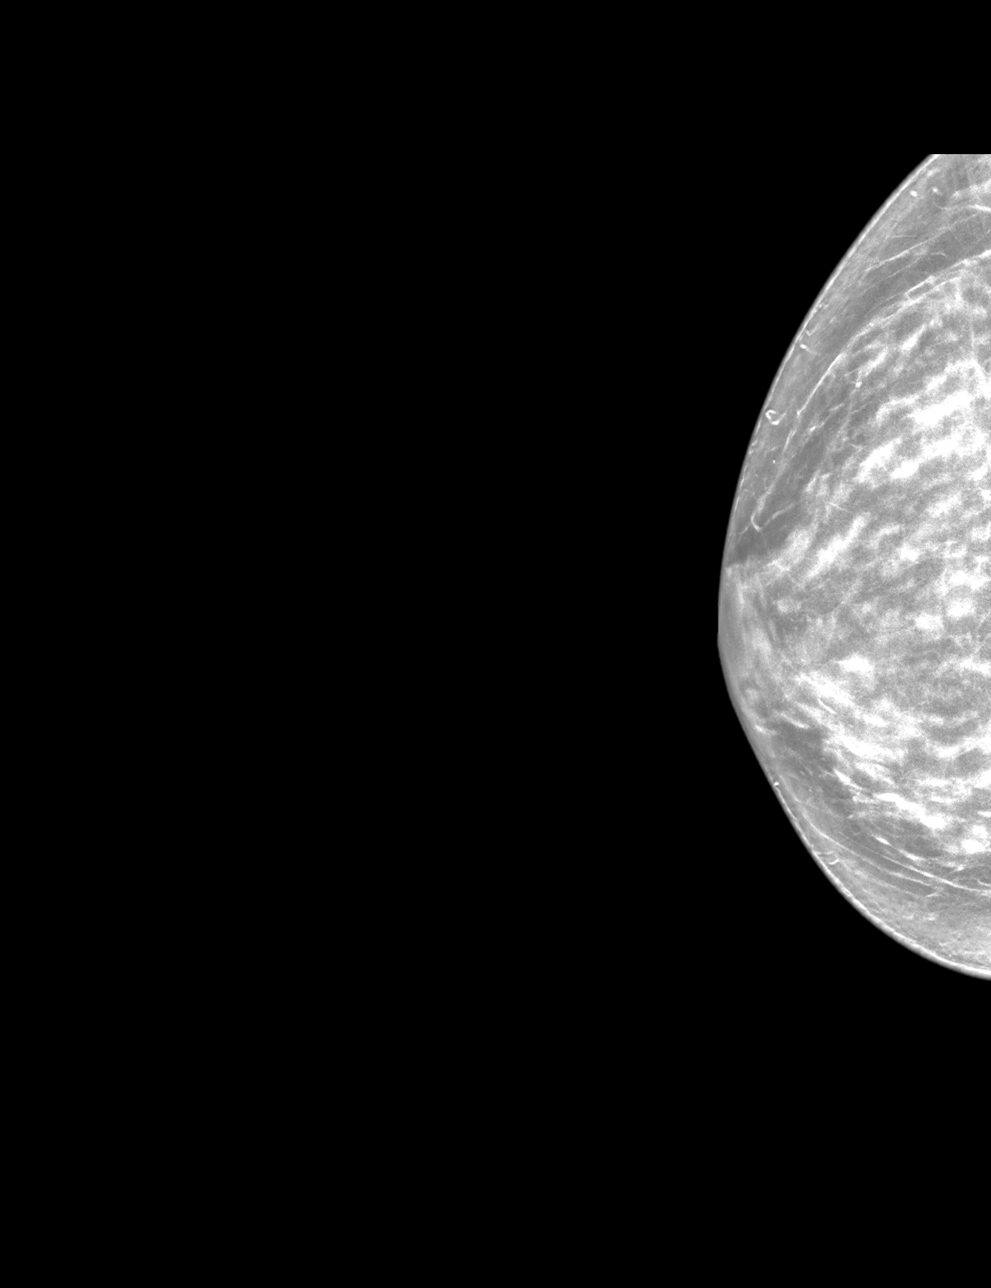

[R MLO synth-2D]
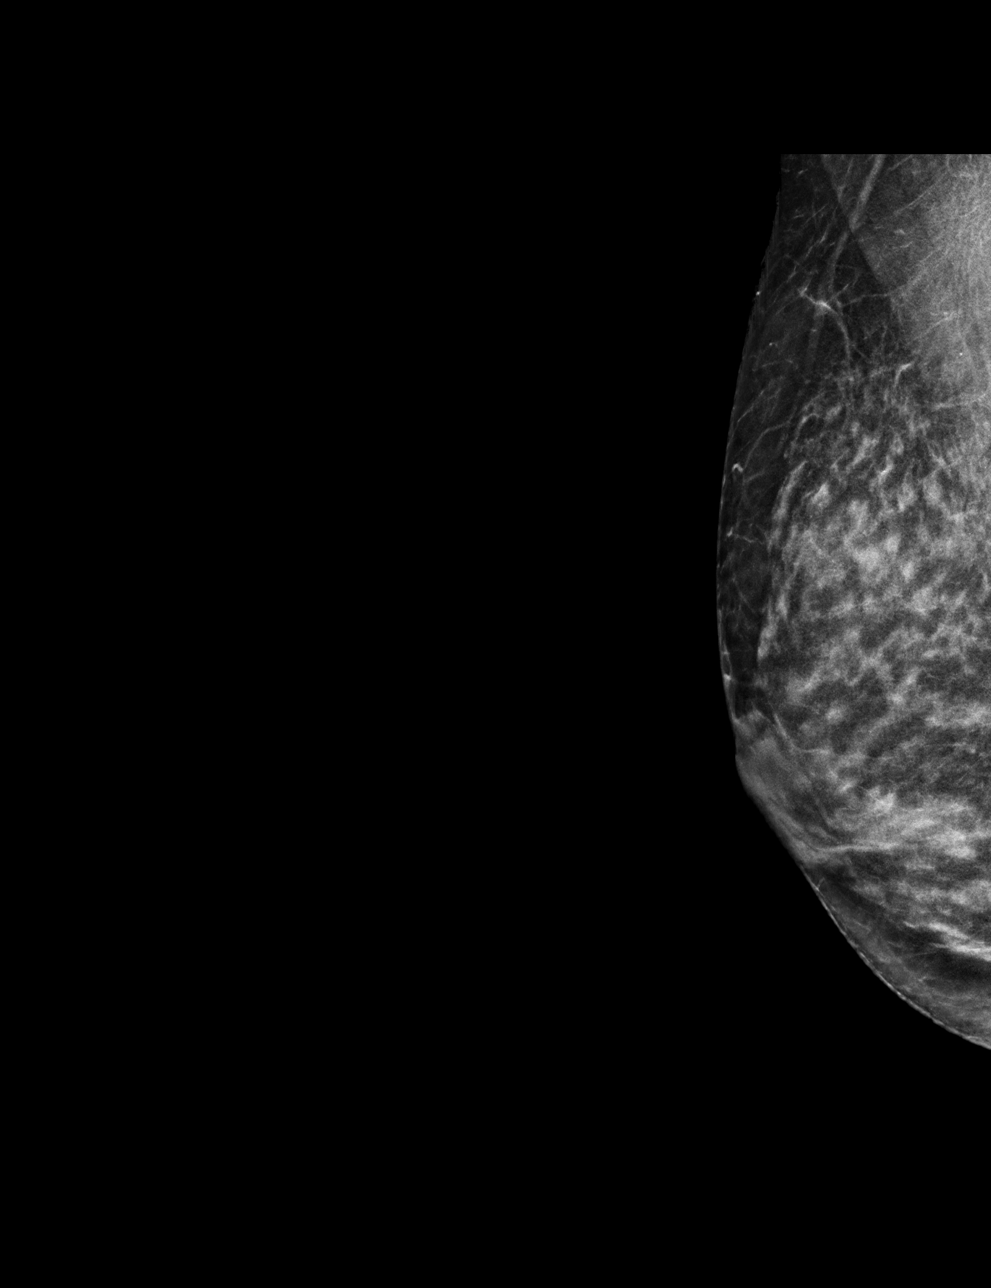

[R CC tomo · tomo slice 30/59.0]
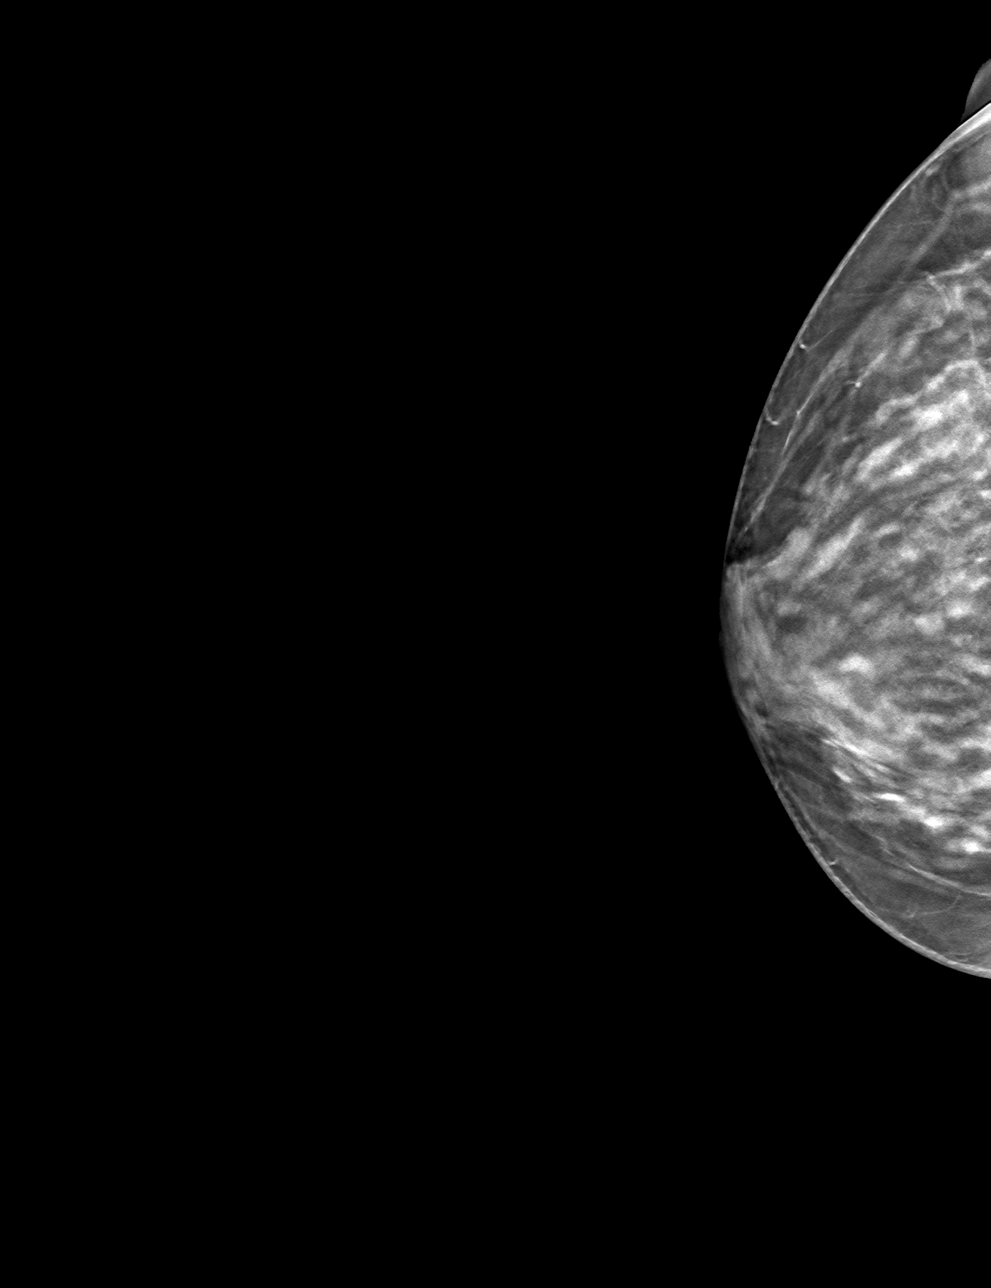

[R MLO tomo · tomo slice 27/53.0]
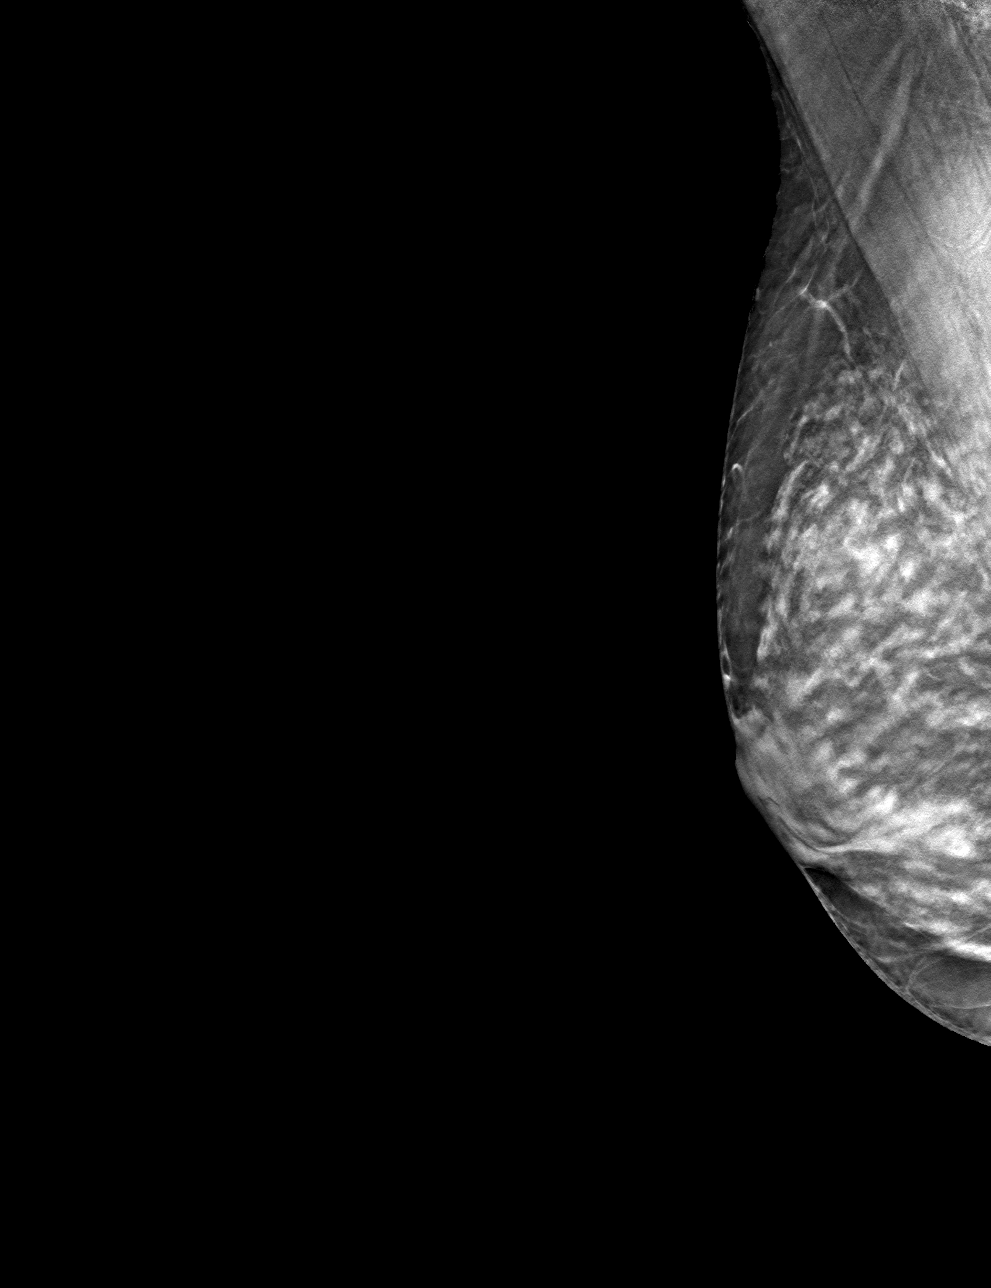

[4 of 12 positions shown; findings below may reference images not displayed]

ACR Breast Density Category c: The breast tissue is heterogeneously
dense, which may obscure small masses.
FINDINGS: Additional imaging of the right breast was performed. There is no
persistent mass, distortion or malignant type microcalcifications.

Mammographic images were processed with CAD.
IMPRESSION: No evidence of malignancy in the right breast.

RECOMMENDATION:
Bilateral screening mammogram in 1 year is recommended.

I have discussed the findings and recommendations with the patient.
Results were also provided in writing at the conclusion of the
visit. If applicable, a reminder letter will be sent to the patient
regarding the next appointment.

BI-RADS CATEGORY  1: Negative.

## 2020-02-09 ENCOUNTER — Telehealth: Payer: Self-pay | Admitting: Orthopaedic Surgery

## 2020-02-09 NOTE — Telephone Encounter (Signed)
Spoke with [employer] w/ [facility]. I verified patient was not seen 02/12/2019 and that there was no phone call on 02/12/2019. I advised there was phone call 11/9. auth on file

## 2020-02-17 ENCOUNTER — Telehealth: Payer: Self-pay

## 2020-02-17 NOTE — Telephone Encounter (Signed)
Patient last seen in March of 2020 for low back pain. Please advise.

## 2020-02-17 NOTE — Telephone Encounter (Signed)
Patient called in saying she is still in pain , says she would like to have something stronger prescribed.

## 2020-02-17 NOTE — Telephone Encounter (Signed)
Tried to call patient. No answer. Left message asking patient to call me back with more info on what she has tried already for pain, and where she is hurting. It looks like it has been quite awhile since we saw her in the office.

## 2020-02-17 NOTE — Telephone Encounter (Signed)
Please call and ask if she can tolerate tramadol-if so ,tramadol 50mg  #30 1 tab po bid prn

## 2020-02-18 ENCOUNTER — Ambulatory Visit (INDEPENDENT_AMBULATORY_CARE_PROVIDER_SITE_OTHER): Payer: BC Managed Care – PPO | Admitting: Orthopaedic Surgery

## 2020-02-18 ENCOUNTER — Other Ambulatory Visit: Payer: Self-pay

## 2020-02-18 ENCOUNTER — Ambulatory Visit: Payer: Self-pay

## 2020-02-18 ENCOUNTER — Encounter: Payer: Self-pay | Admitting: Orthopaedic Surgery

## 2020-02-18 VITALS — Ht 62.0 in | Wt 112.0 lb

## 2020-02-18 DIAGNOSIS — G8929 Other chronic pain: Secondary | ICD-10-CM

## 2020-02-18 DIAGNOSIS — M5441 Lumbago with sciatica, right side: Secondary | ICD-10-CM | POA: Diagnosis not present

## 2020-02-18 DIAGNOSIS — M545 Low back pain, unspecified: Secondary | ICD-10-CM | POA: Insufficient documentation

## 2020-02-18 MED ORDER — TRAMADOL HCL 50 MG PO TABS: 50.0000 mg | ORAL_TABLET | Freq: Three times a day (TID) | ORAL | 0 refills | Status: DC | PRN
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
## 2020-03-25 ENCOUNTER — Ambulatory Visit
Admission: EM | Admit: 2020-03-25 | Discharge: 2020-03-25 | Disposition: A | Discharge status: Home / Self Care | Payer: BC Managed Care – PPO | Attending: Emergency Medicine | Admitting: Emergency Medicine

## 2020-03-25 ENCOUNTER — Other Ambulatory Visit: Payer: Self-pay

## 2020-03-25 ENCOUNTER — Encounter: Payer: Self-pay | Admitting: Emergency Medicine

## 2020-03-25 DIAGNOSIS — R197 Diarrhea, unspecified: Secondary | ICD-10-CM

## 2020-03-25 LAB — POCT URINALYSIS DIP (MANUAL ENTRY)
Bilirubin, UA: NEGATIVE
Glucose, UA: NEGATIVE mg/dL
Ketones, POC UA: NEGATIVE mg/dL
Leukocytes, UA: NEGATIVE
Nitrite, UA: NEGATIVE
Protein Ur, POC: NEGATIVE mg/dL
Spec Grav, UA: 1.01 (ref 1.010–1.025)
Urobilinogen, UA: 0.2 E.U./dL
pH, UA: 7 (ref 5.0–8.0)

## 2020-03-25 MED ORDER — DICYCLOMINE HCL 20 MG PO TABS: 20.0000 mg | ORAL_TABLET | Freq: Three times a day (TID) | ORAL | 0 refills | Status: DC

## 2020-03-25 MED ORDER — CIPROFLOXACIN HCL 500 MG PO TABS: 500.0000 mg | ORAL_TABLET | Freq: Two times a day (BID) | ORAL | 0 refills | Status: AC

## 2020-03-25 NOTE — ED Triage Notes (Signed)
Pt here for diarrhea and abd pain x 1 week; pt sts diarrhea is mucous like

## 2020-03-25 NOTE — Discharge Instructions (Signed)
Cipro twice daily for 3 days If not improved, please return stool sample Bentyl before meals and bedtime for cramping Drink plenty of fluids Follow up with primary care/gastroenterology if not improving
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
## 2020-03-28 ENCOUNTER — Telehealth: Payer: Self-pay

## 2020-03-29 DIAGNOSIS — R197 Diarrhea, unspecified: Secondary | ICD-10-CM | POA: Insufficient documentation

## 2020-03-30 LAB — GASTROINTESTINAL PANEL BY PCR, STOOL (REPLACES STOOL CULTURE)

## 2020-04-06 ENCOUNTER — Other Ambulatory Visit: Payer: Self-pay | Admitting: Orthopaedic Surgery

## 2020-04-06 DIAGNOSIS — G8929 Other chronic pain: Secondary | ICD-10-CM

## 2020-11-18 DIAGNOSIS — J3489 Other specified disorders of nose and nasal sinuses: Secondary | ICD-10-CM | POA: Diagnosis not present

## 2020-11-18 DIAGNOSIS — U099 Post covid-19 condition, unspecified: Secondary | ICD-10-CM | POA: Diagnosis not present

## 2020-11-18 DIAGNOSIS — J019 Acute sinusitis, unspecified: Secondary | ICD-10-CM | POA: Diagnosis not present

## 2020-11-24 DIAGNOSIS — R197 Diarrhea, unspecified: Secondary | ICD-10-CM | POA: Diagnosis not present

## 2020-12-08 DIAGNOSIS — A0472 Enterocolitis due to Clostridium difficile, not specified as recurrent: Secondary | ICD-10-CM | POA: Diagnosis not present

## 2020-12-09 DIAGNOSIS — A0472 Enterocolitis due to Clostridium difficile, not specified as recurrent: Secondary | ICD-10-CM | POA: Diagnosis not present

## 2020-12-22 DIAGNOSIS — Z0000 Encounter for general adult medical examination without abnormal findings: Secondary | ICD-10-CM | POA: Diagnosis not present

## 2021-01-01 ENCOUNTER — Telehealth: Payer: Self-pay | Admitting: Gastroenterology

## 2021-01-01 NOTE — Telephone Encounter (Signed)
This is a patient of Dr. Kenna Gilbert.  She says she was last seen by Dr. Loreta Ave and February or March.  Says that she was diagnosed with C. difficile recently at [facility].  They placed her on metronidazole.  She says that she has 3 days left of the course and has been having ringing in her ears and dark urine.  She called [facility] back today and they told her to stop the metronidazole and called her in a prescription for vancomycin instead.  They told her to reach out to her gastroenterologist for an appointment.  I told her I agree with discontinuing the metronidazole and starting the vancomycin.  I have asked her to make sure that she is hydrating well with about 64 ounces of fluid, water or water/Gatorade.  If she is feeling poorly then she needs to return to urgent care or the ER.  She sounds well on the phone.  She is going to contact Dr. Kenna Gilbert office tomorrow morning for further recommendations and follow-up.

## 2021-01-02 DIAGNOSIS — K5900 Constipation, unspecified: Secondary | ICD-10-CM | POA: Diagnosis not present

## 2021-01-02 DIAGNOSIS — A0472 Enterocolitis due to Clostridium difficile, not specified as recurrent: Secondary | ICD-10-CM | POA: Diagnosis not present

## 2021-01-03 DIAGNOSIS — R82998 Other abnormal findings in urine: Secondary | ICD-10-CM | POA: Diagnosis not present

## 2021-01-17 DIAGNOSIS — K219 Gastro-esophageal reflux disease without esophagitis: Secondary | ICD-10-CM | POA: Diagnosis not present

## 2021-01-17 DIAGNOSIS — R140 Abdominal distension (gaseous): Secondary | ICD-10-CM | POA: Diagnosis not present

## 2021-01-17 DIAGNOSIS — R194 Change in bowel habit: Secondary | ICD-10-CM | POA: Diagnosis not present

## 2021-04-06 DIAGNOSIS — L299 Pruritus, unspecified: Secondary | ICD-10-CM | POA: Diagnosis not present

## 2021-04-06 DIAGNOSIS — R42 Dizziness and giddiness: Secondary | ICD-10-CM | POA: Diagnosis not present

## 2021-04-06 DIAGNOSIS — H9201 Otalgia, right ear: Secondary | ICD-10-CM | POA: Diagnosis not present

## 2021-04-06 DIAGNOSIS — M26621 Arthralgia of right temporomandibular joint: Secondary | ICD-10-CM | POA: Diagnosis not present

## 2021-05-13 ENCOUNTER — Other Ambulatory Visit: Payer: Self-pay

## 2021-05-13 ENCOUNTER — Ambulatory Visit
Admission: EM | Admit: 2021-05-13 | Discharge: 2021-05-13 | Disposition: A | Discharge status: Home / Self Care | Payer: Medicare Other | Attending: Internal Medicine | Admitting: Internal Medicine

## 2021-05-13 ENCOUNTER — Encounter: Payer: Self-pay | Admitting: Emergency Medicine

## 2021-05-13 DIAGNOSIS — B009 Herpesviral infection, unspecified: Secondary | ICD-10-CM | POA: Diagnosis not present

## 2021-05-13 MED ORDER — VALACYCLOVIR HCL 1 G PO TABS: 1000.0000 mg | ORAL_TABLET | Freq: Every day | ORAL | 0 refills | Status: AC

## 2021-05-13 NOTE — ED Provider Notes (Signed)
[facility]    CSN: [id number] Arrival date & time: 05/13/21  0915      History   Chief Complaint Chief Complaint  Patient presents with   Insect Bite    HPI Marcia Walker is a 66 y.o. female.   Patient presents with lesion to left buttocks that has been present over the past few days.  Denies any drainage from the lesions.  Denies any fevers, body aches, chills.  Patient has applied Cortaid, taken 1 Valtrex pill, used antibacterial soap, mupirocin with no improvement.  She does report that she has a history of genital herpes.  She typically takes Valtrex with resolution but only took 1 pill this time.    Past Medical History:  Diagnosis Date   Felon of finger of right hand with lymphangitis 12/17/2011   GERD (gastroesophageal reflux disease)    Headache(784.0)    with allergies    Patient Active Problem List   Diagnosis Date Noted   Low back pain 02/18/2020   Hemoperitoneum 08/04/2015   Abdominal pain in female 08/04/2015   S/P colonoscopy 08/04/2015   GERD (gastroesophageal reflux disease)    Gastroesophageal reflux disease with esophagitis     Past Surgical History:  Procedure Laterality Date   I & D EXTREMITY  12/17/2011   Procedure: IRRIGATION AND DEBRIDEMENT EXTREMITY;  Surgeon: Schuyler Amor, MD;  Location: [facility];  Service: Orthopedics;  Laterality: Right;  Right Ring Finger   left shouler manipulation  01/2010   OTHER SURGICAL HISTORY  15 yrs ago   cyst removed from end of spine   right shoulder manipulation 03/2011  03/2011   UTERINE FIBROID SURGERY  2007    OB History   No obstetric history on file.      Home Medications    Prior to Admission medications   Medication Sig Start Date End Date Taking? Authorizing Provider  cetirizine (ZYRTEC) 10 MG tablet Take by mouth.   Yes [provider]  diclofenac sodium (VOLTAREN) 1 % GEL Apply 4 g topically 3 (three) times daily as needed (Apply to affected area TID PRN). 06/13/18   Yes Garald Balding, MD  EPINEPHrine 0.3 mg/0.3 mL IJ SOAJ injection Inject 0.3 mLs (0.3 mg total) into the muscle once. 05/21/15  Yes Melynda Ripple, MD  pantoprazole (PROTONIX) 20 MG tablet Take 1 tablet (20 mg total) by mouth daily. 02/27/15  Yes Upstill, Nehemiah Settle, PA-C  polyethylene glycol (MIRALAX) packet Take 17 g by mouth daily as needed for mild constipation. Take up to 3 times daily until bowels move. 08/07/15  Yes Domenic Polite, MD  Probiotic Product (ACIDOPHILUS HIGH-POTENCY PO) Take by mouth.   Yes [provider]  valACYclovir (VALTREX) 1000 MG tablet Take 1 tablet (1,000 mg total) by mouth daily for 5 days. 05/13/21 05/18/21 Yes Autumne Kallio, Michele Rockers, FNP  cycloSPORINE (RESTASIS) 0.05 % ophthalmic emulsion Place 1 drop into both eyes at bedtime as needed (for allergy eye).     [provider]  dicyclomine (BENTYL) 20 MG tablet Take 1 tablet (20 mg total) by mouth 4 (four) times daily -  before meals and at bedtime. 03/25/20   Wieters, Hallie C, PA-C  methocarbamol (ROBAXIN) 500 MG tablet Take 1 tablet (500 mg total) by mouth 2 (two) times daily as needed for muscle spasms. 05/13/17   Garald Balding, MD  traMADol (ULTRAM) 50 MG tablet Take 1 tablet (50 mg total) by mouth every 8 (eight) hours as needed for moderate pain  or severe pain. 02/18/20   Cherylann Ratel, PA-C    Family History Family History  Problem Relation Age of Onset   Hypertension Mother    Diabetes Father    Diabetes Paternal Grandmother        also gluacoma   Breast cancer Neg Hx     Social History Social History   Tobacco Use   Smoking status: Never   Smokeless tobacco: Never  Substance Use Topics   Alcohol use: No   Drug use: No     Allergies   Prednisone, Amitiza [lubiprostone], Claritin [loratadine], Other, Penicillins, Propoxyphene, and Vibramycin [doxycycline monohydrate]   Review of Systems Review of Systems Per HPI  Physical Exam Triage Vital Signs ED Triage Vitals   Enc Vitals Group     BP 05/13/21 1029 (!) 147/82     Pulse Rate 05/13/21 1029 89     Resp 05/13/21 1029 18     Temp 05/13/21 1029 98.2 F (36.8 C)     Temp Source 05/13/21 1029 Oral     SpO2 05/13/21 1029 96 %     Weight 05/13/21 1031 109 lb (49.4 kg)     Height 05/13/21 1031 5\' 2"  (1.575 m)     Head Circumference --      Peak Flow --      Pain Score 05/13/21 1031 3     Pain Loc --      Pain Edu? --      Excl. in [place]? --    No data found.  Updated Vital Signs BP (!) 147/82 (BP Location: Left Arm)    Pulse 89    Temp 98.2 F (36.8 C) (Oral)    Resp 18    Ht 5\' 2"  (1.575 m)    Wt 109 lb (49.4 kg)    SpO2 96%    BMI 19.94 kg/m   Visual Acuity Right Eye Distance:   Left Eye Distance:   Bilateral Distance:    Right Eye Near:   Left Eye Near:    Bilateral Near:     Physical Exam Constitutional:      General: She is not in acute distress.    Appearance: Normal appearance. She is not toxic-appearing or diaphoretic.  HENT:     Head: Normocephalic and atraumatic.  Eyes:     Extraocular Movements: Extraocular movements intact.     Conjunctiva/sclera: Conjunctivae normal.  Pulmonary:     Effort: Pulmonary effort is normal.  Skin:         Comments: Multiple vesicular lesions with surrounding erythema present to left buttocks close to intergluteal cleft.  Neurological:     General: No focal deficit present.     Mental Status: She is alert and oriented to person, place, and time. Mental status is at baseline.  Psychiatric:        Mood and Affect: Mood normal.        Behavior: Behavior normal.        Thought Content: Thought content normal.        Judgment: Judgment normal.     UC Treatments / Results  Labs (all labs ordered are listed, but only abnormal results are displayed) Labs Reviewed  HSV CULTURE AND TYPING    EKG   Radiology No results found.  Procedures Procedures (including critical care time)  Medications Ordered in UC Medications - No data to  display  Initial Impression / Assessment and Plan / UC Course  I have reviewed the triage vital  signs and the nursing notes.  Pertinent labs & imaging results that were available during my care of the patient were reviewed by me and considered in my medical decision making (see chart for details).     Lesions to buttocks are consistent with herpes simplex.  Do think the patient is having herpes outbreak.  Will treat with Valtrex.  Discussed return precautions.  HSV swab pending.  Patient verbalized understanding and was agreeable with plan. Final Clinical Impressions(s) / UC Diagnoses   Final diagnoses:  Herpes simplex     Discharge Instructions      It appears that you have a genital herpes outbreak which is being treated with Valtrex.  Swab is pending.  We will call if it is positive.    ED Prescriptions     Medication Sig Dispense Auth. Provider   valACYclovir (VALTREX) 1000 MG tablet Take 1 tablet (1,000 mg total) by mouth daily for 5 days. 5 tablet [place], Michele Rockers, [place]      PDMP not reviewed this encounter.   Teodora Medici, [place] 05/13/21 1102

## 2021-05-13 NOTE — ED Triage Notes (Signed)
Patient c/o possible insect bite on bottom, possible MRSA or Staph?  The area is getting angrier and reedier, applied Cort-Aid, Valtrex, Anti-Bacterial Soap, Mupirocin ointment.

## 2021-05-13 NOTE — Discharge Instructions (Addendum)
It appears that you have a genital herpes outbreak which is being treated with Valtrex.  Swab is pending.  We will call if it is positive.

## 2021-05-16 LAB — HSV CULTURE AND TYPING

## 2021-06-15 DIAGNOSIS — Z1231 Encounter for screening mammogram for malignant neoplasm of breast: Secondary | ICD-10-CM | POA: Diagnosis not present

## 2021-06-22 DIAGNOSIS — H4302 Vitreous prolapse, left eye: Secondary | ICD-10-CM | POA: Diagnosis not present

## 2021-07-31 DIAGNOSIS — H526 Other disorders of refraction: Secondary | ICD-10-CM | POA: Diagnosis not present

## 2021-08-17 DIAGNOSIS — K219 Gastro-esophageal reflux disease without esophagitis: Secondary | ICD-10-CM | POA: Diagnosis not present

## 2021-08-17 DIAGNOSIS — K582 Mixed irritable bowel syndrome: Secondary | ICD-10-CM | POA: Diagnosis not present

## 2021-08-17 DIAGNOSIS — F419 Anxiety disorder, unspecified: Secondary | ICD-10-CM | POA: Diagnosis not present

## 2021-08-17 DIAGNOSIS — R140 Abdominal distension (gaseous): Secondary | ICD-10-CM | POA: Diagnosis not present

## 2021-08-30 DIAGNOSIS — Z1211 Encounter for screening for malignant neoplasm of colon: Secondary | ICD-10-CM | POA: Diagnosis not present

## 2021-08-30 DIAGNOSIS — Z0001 Encounter for general adult medical examination with abnormal findings: Secondary | ICD-10-CM | POA: Diagnosis not present

## 2021-08-30 DIAGNOSIS — Z23 Encounter for immunization: Secondary | ICD-10-CM | POA: Diagnosis not present

## 2021-08-30 DIAGNOSIS — Z136 Encounter for screening for cardiovascular disorders: Secondary | ICD-10-CM | POA: Diagnosis not present

## 2021-09-01 DIAGNOSIS — R197 Diarrhea, unspecified: Secondary | ICD-10-CM | POA: Diagnosis not present

## 2021-09-01 DIAGNOSIS — Z1159 Encounter for screening for other viral diseases: Secondary | ICD-10-CM | POA: Diagnosis not present

## 2021-09-01 DIAGNOSIS — E559 Vitamin D deficiency, unspecified: Secondary | ICD-10-CM | POA: Diagnosis not present

## 2021-09-01 DIAGNOSIS — E538 Deficiency of other specified B group vitamins: Secondary | ICD-10-CM | POA: Diagnosis not present

## 2021-09-04 DIAGNOSIS — Z0001 Encounter for general adult medical examination with abnormal findings: Secondary | ICD-10-CM | POA: Diagnosis not present

## 2021-09-04 DIAGNOSIS — R197 Diarrhea, unspecified: Secondary | ICD-10-CM | POA: Diagnosis not present

## 2021-09-04 DIAGNOSIS — E559 Vitamin D deficiency, unspecified: Secondary | ICD-10-CM | POA: Diagnosis not present

## 2021-09-04 DIAGNOSIS — E538 Deficiency of other specified B group vitamins: Secondary | ICD-10-CM | POA: Diagnosis not present

## 2021-09-25 DIAGNOSIS — H04123 Dry eye syndrome of bilateral lacrimal glands: Secondary | ICD-10-CM | POA: Diagnosis not present

## 2021-10-05 DIAGNOSIS — R634 Abnormal weight loss: Secondary | ICD-10-CM | POA: Diagnosis not present

## 2021-10-05 DIAGNOSIS — R350 Frequency of micturition: Secondary | ICD-10-CM | POA: Diagnosis not present

## 2022-01-09 DIAGNOSIS — R42 Dizziness and giddiness: Secondary | ICD-10-CM | POA: Diagnosis not present

## 2022-01-09 DIAGNOSIS — J309 Allergic rhinitis, unspecified: Secondary | ICD-10-CM | POA: Diagnosis not present

## 2022-01-09 DIAGNOSIS — J019 Acute sinusitis, unspecified: Secondary | ICD-10-CM | POA: Diagnosis not present

## 2022-01-09 DIAGNOSIS — R591 Generalized enlarged lymph nodes: Secondary | ICD-10-CM | POA: Diagnosis not present

## 2022-02-01 DIAGNOSIS — R030 Elevated blood-pressure reading, without diagnosis of hypertension: Secondary | ICD-10-CM | POA: Diagnosis not present

## 2022-02-01 DIAGNOSIS — L600 Ingrowing nail: Secondary | ICD-10-CM | POA: Diagnosis not present

## 2022-04-09 DIAGNOSIS — Z01419 Encounter for gynecological examination (general) (routine) without abnormal findings: Secondary | ICD-10-CM | POA: Diagnosis not present

## 2022-04-09 DIAGNOSIS — Z124 Encounter for screening for malignant neoplasm of cervix: Secondary | ICD-10-CM | POA: Diagnosis not present

## 2022-04-09 DIAGNOSIS — Z6820 Body mass index (BMI) 20.0-20.9, adult: Secondary | ICD-10-CM | POA: Diagnosis not present

## 2022-04-24 DIAGNOSIS — H1089 Other conjunctivitis: Secondary | ICD-10-CM | POA: Diagnosis not present

## 2022-05-04 ENCOUNTER — Encounter: Payer: Self-pay | Admitting: Physician Assistant

## 2022-05-04 ENCOUNTER — Telehealth: Payer: Self-pay | Admitting: Physician Assistant

## 2022-05-04 ENCOUNTER — Ambulatory Visit (INDEPENDENT_AMBULATORY_CARE_PROVIDER_SITE_OTHER): Payer: Medicare Other

## 2022-05-04 ENCOUNTER — Ambulatory Visit: Payer: Medicare Other | Admitting: Physician Assistant

## 2022-05-04 DIAGNOSIS — M79671 Pain in right foot: Secondary | ICD-10-CM

## 2022-05-04 DIAGNOSIS — M65342 Trigger finger, left ring finger: Secondary | ICD-10-CM | POA: Diagnosis not present

## 2022-05-04 MED ORDER — LIDOCAINE HCL 1 % IJ SOLN
0.5000 mL | INTRAMUSCULAR | Status: AC | PRN
Start: 2022-05-04 — End: 2022-05-04
  Administered 2022-05-04: .5 mL

## 2022-05-04 MED ORDER — METHYLPREDNISOLONE ACETATE 40 MG/ML IJ SUSP
20.0000 mg | INTRAMUSCULAR | Status: AC | PRN
  Administered 2022-05-04: 20 mg

## 2022-05-04 NOTE — Progress Notes (Signed)
Office Visit Note   Patient: Marcia Walker           Date of Birth: [birth year]           MRN: [id number] Visit Date: 05/04/2022              Requested by: No referring provider defined for this encounter. 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  She is a former patient of Dr. Rudene Anda.  She comes in today complaining of triggering of her left ring finger.  It is also quite painful.  She denies any injuries except for a remote history of being bitten by a spider at the tip of this finger which is always given her sensation changes.  She is right-handed.  She has tried Voltaren gel and icing it has not helped much. She also comes in with some right foot pain.  She said she had been treated in the past by Dr. Durward Fortes for a tendon injury and was placed in a boot.  She still has some pain in the boot and was told that she might do well with arch supports.  She denies any recent injury.  Points to her pain on the dorsum of the foot and along the ankle centrally denies any paresthesias  Assessment & Plan: Visit Diagnoses:  1. Pain in right foot   2. Trigger finger, left ring finger     Plan: She does have painful triggering of the A1 pulley of the ring finger of left hand.  Discussed the natural history of this to go forward with an injection.  Should continue with Voltaren as needed.  Follow-up if it does not improve.  I would consider referring her to a hand surgeon Right foot pain.  She has a benign exam today.  She does have a little tenderness along the tibialis anterior and EHL but they function well.  Will treat this with orthotics.  Also instructed her again use some anti-inflammatories.  She does not have any sign of infection or neurovascular compromise.  Would refer her to Dr. Sharol Given if this does not improve  Follow-Up  Instructions: No follow-ups on file.   Ortho Exam  Patient is alert, oriented, no adenopathy, well-dressed, normal affect, normal respiratory effort. Examination of her right foot she has a strong dorsalis pedis pulse good at capillary refill.  No pain with manipulation of the midfoot.  Good inversion eversion dorsiflexion and plantarflexion strength.  Good EHL strength.  Some tenderness over the tibialis anterior but no tenderness over the ankle joint itself.  Compartments are soft and compressible she is neurovascularly intact  Imaging: XR Foot Complete Right  Result Date: 05/04/2022 Radiographs of her right foot were obtained today she.  Has well-maintained alignment no acute fractures no significant abnormalities or degeneration  No images are attached to the encounter.  Labs: Lab Results  Component Value Date   REPTSTATUS 12/21/2011 FINAL 12/17/2011   REPTSTATUS 12/22/2011 FINAL 12/17/2011   REPTSTATUS 12/17/2011 FINAL 12/17/2011   GRAMSTAIN  12/17/2011    FEW WBC PRESENT,BOTH PMN AND MONONUCLEAR NO SQUAMOUS EPITHELIAL CELLS SEEN NO ORGANISMS SEEN Gram Stain Report Called to,Read Back By and Verified With: Gram Stain Report Called to,Read Back By and Verified With: DR Burney Gauze 1935 12/17/11 BY A BROWNING Performed at  [facility]  12/17/2011    FEW WBC PRESENT,BOTH PMN AND MONONUCLEAR NO SQUAMOUS EPITHELIAL CELLS SEEN NO ORGANISMS SEEN Performed at [facility]  12/17/2011    FEW WBC PRESENT,BOTH PMN AND MONONUCLEAR NO ORGANISMS SEEN Gram Stain Report Called to,Read Back By and Verified With: DR Burney Gauze 1935 12/17/11 A BROWNING   CULT  12/17/2011    MULTIPLE ORGANISMS PRESENT, NONE PREDOMINANT Note: NO STAPHYLOCOCCUS AUREUS ISOLATED NO GROUP A STREP (S.PYOGENES) ISOLATED   CULT NO ANAEROBES ISOLATED 12/17/2011     Lab Results  Component Value Date   ALBUMIN 3.8 08/03/2015   ALBUMIN 4.8 02/26/2015    No results found for: "MG" No  results found for: "VD25OH"  No results found for: "PREALBUMIN"    Latest Ref Rng & Units 08/07/2015    5:52 AM 08/06/2015    5:57 PM 08/06/2015    5:59 AM  CBC EXTENDED  WBC 4.0 - 10.5 K/uL 4.5  4.2  4.3   RBC 3.87 - 5.11 MIL/uL 2.93  2.87  2.90   Hemoglobin 12.0 - 15.0 g/dL 8.4  8.3  8.5   HCT 36.0 - 46.0 % 25.6  25.2  26.1   Platelets 150 - 400 K/uL 214  206  212      There is no height or weight on file to calculate BMI.  Orders:  Orders Placed This Encounter  Procedures   XR Foot Complete Right   No orders of the defined types were placed in this encounter.    Procedures: Hand/UE Inj for trigger finger on 05/04/2022 3:22 PM Indications: diagnostic and therapeutic Details: 27 G needle, volar approach Medications: 0.5 mL lidocaine 1 %; 20 mg methylPREDNISolone acetate 40 MG/ML Outcome: tolerated well, no immediate complications Procedure, treatment alternatives, risks and benefits explained, specific risks discussed. Consent was given by the patient.    Clinical Data: No additional findings.  ROS:  All other systems negative, except as noted in the HPI. Review of Systems  Objective: Vital Signs: There were no vitals taken for this visit.  Specialty Comments:  No specialty comments available.  PMFS History: Patient Active Problem List   Diagnosis Date Noted   Pain in right foot 05/04/2022   Trigger finger, left ring finger 05/04/2022   Low back pain 02/18/2020   Hemoperitoneum 08/04/2015   Abdominal pain in female 08/04/2015   S/P colonoscopy 08/04/2015   GERD (gastroesophageal reflux disease)    Gastroesophageal reflux disease with esophagitis    Past Medical History:  Diagnosis Date   Felon of finger of right hand with lymphangitis 12/17/2011   GERD (gastroesophageal reflux disease)    Headache(784.0)    with allergies    Family History  Problem Relation Age of Onset   Hypertension Mother    Diabetes Father    Diabetes Paternal Grandmother         also gluacoma   Breast cancer Neg Hx     Past Surgical History:  Procedure Laterality Date   I & D EXTREMITY  12/17/2011   Procedure: IRRIGATION AND DEBRIDEMENT EXTREMITY;  Surgeon: Schuyler Amor, MD;  Location: [facility];  Service: Orthopedics;  Laterality: Right;  Right Ring Finger   left shouler manipulation  01/2010   OTHER SURGICAL HISTORY  15 yrs ago   cyst removed from end of spine   right shoulder manipulation 03/2011  03/2011   UTERINE FIBROID SURGERY  2007   Social History   Occupational History  Not on file  Tobacco Use   Smoking status: Never   Smokeless tobacco: Never  Substance and Sexual Activity   Alcohol use: No   Drug use: No   Sexual activity: Not on file

## 2022-05-04 NOTE — Telephone Encounter (Signed)
I called, no answer.  ?

## 2022-05-04 NOTE — Telephone Encounter (Signed)
Patient would like to know is there a stronger version on Voltaran that can be prescribed, the otc one is not working and if so can we prescribe it please advise

## 2022-05-07 ENCOUNTER — Telehealth: Payer: Self-pay | Admitting: Physician Assistant

## 2022-05-07 NOTE — Telephone Encounter (Signed)
Patient states she went to pharmacy to get Rx and no Rx called in. Please advise.Marland Kitchen

## 2022-05-08 MED ORDER — DICLOFENAC SODIUM 1 % EX GEL: 4.0000 g | Freq: Three times a day (TID) | CUTANEOUS | 0 refills | Status: AC | PRN

## 2022-05-08 NOTE — Telephone Encounter (Signed)
FYI  Per our discussion, rx sent to pharmacy.

## 2022-05-08 NOTE — Addendum Note (Signed)
Addended by: Meyer Cory on: 05/08/2022 01:17 PM   Modules accepted: Orders

## 2022-05-08 NOTE — Telephone Encounter (Signed)
Patient states that you all discussed over the counter voltaren gel and how it does not help her like the voltaren gel Dr. Durward Fortes prescribed for her. You all talked about how the prescription was a better strength.  She would like a prescription sent to [facility] on [address].   She also wanted you to know that the supports you gave her in the office are helping.  Please advise on rx.

## 2022-05-14 DIAGNOSIS — J309 Allergic rhinitis, unspecified: Secondary | ICD-10-CM | POA: Diagnosis not present

## 2022-06-04 DIAGNOSIS — R0981 Nasal congestion: Secondary | ICD-10-CM | POA: Diagnosis not present

## 2022-06-04 DIAGNOSIS — H6503 Acute serous otitis media, bilateral: Secondary | ICD-10-CM | POA: Diagnosis not present

## 2022-06-04 DIAGNOSIS — R040 Epistaxis: Secondary | ICD-10-CM | POA: Diagnosis not present

## 2022-06-04 DIAGNOSIS — H8111 Benign paroxysmal vertigo, right ear: Secondary | ICD-10-CM | POA: Diagnosis not present

## 2022-06-14 DIAGNOSIS — K582 Mixed irritable bowel syndrome: Secondary | ICD-10-CM | POA: Diagnosis not present

## 2022-06-14 DIAGNOSIS — R040 Epistaxis: Secondary | ICD-10-CM | POA: Diagnosis not present

## 2022-06-14 DIAGNOSIS — E785 Hyperlipidemia, unspecified: Secondary | ICD-10-CM | POA: Diagnosis not present

## 2022-06-14 DIAGNOSIS — R7303 Prediabetes: Secondary | ICD-10-CM | POA: Diagnosis not present

## 2022-06-14 DIAGNOSIS — K219 Gastro-esophageal reflux disease without esophagitis: Secondary | ICD-10-CM | POA: Diagnosis not present

## 2022-06-14 DIAGNOSIS — R159 Full incontinence of feces: Secondary | ICD-10-CM | POA: Diagnosis not present

## 2022-06-14 DIAGNOSIS — E559 Vitamin D deficiency, unspecified: Secondary | ICD-10-CM | POA: Diagnosis not present

## 2022-06-15 ENCOUNTER — Ambulatory Visit: Payer: Medicare Other | Attending: Internal Medicine

## 2022-06-15 DIAGNOSIS — H8111 Benign paroxysmal vertigo, right ear: Secondary | ICD-10-CM | POA: Diagnosis not present

## 2022-06-15 DIAGNOSIS — R2681 Unsteadiness on feet: Secondary | ICD-10-CM | POA: Diagnosis not present

## 2022-06-15 DIAGNOSIS — R42 Dizziness and giddiness: Secondary | ICD-10-CM | POA: Diagnosis not present

## 2022-06-15 NOTE — Therapy (Signed)
OUTPATIENT PHYSICAL THERAPY VESTIBULAR EVALUATION     Patient Name: Marcia Walker MRN: [id number] DOB:[birth year], 67 y.o., female Today's Date: 06/15/2022  END OF SESSION:  PT End of Session - 06/15/22 0806     Visit Number 1    Number of Visits 9    Date for PT Re-Evaluation 07/27/22    Authorization Type BCBS medicare    PT Start Time 0803    PT Stop Time 0845    PT Time Calculation (min) 42 min    Activity Tolerance Patient tolerated treatment well    Behavior During Therapy [facility] for tasks assessed/performed             Past Medical History:  Diagnosis Date   Felon of finger of right hand with lymphangitis 12/17/2011   GERD (gastroesophageal reflux disease)    Headache(784.0)    with allergies   Past Surgical History:  Procedure Laterality Date   I & D EXTREMITY  12/17/2011   Procedure: IRRIGATION AND DEBRIDEMENT EXTREMITY;  Surgeon: Schuyler Amor, MD;  Location: [facility];  Service: Orthopedics;  Laterality: Right;  Right Ring Finger   left shouler manipulation  01/2010   OTHER SURGICAL HISTORY  15 yrs ago   cyst removed from end of spine   right shoulder manipulation 03/2011  03/2011   UTERINE FIBROID SURGERY  2007   Patient Active Problem List   Diagnosis Date Noted   Pain in right foot 05/04/2022   Trigger finger, left ring finger 05/04/2022   Low back pain 02/18/2020   Hemoperitoneum 08/04/2015   Abdominal pain in female 08/04/2015   S/P colonoscopy 08/04/2015   GERD (gastroesophageal reflux disease)    Gastroesophageal reflux disease with esophagitis     PCP: Latanya Presser, MD REFERRING PROVIDER: Latanya Presser, MD  REFERRING DIAG: R42 (ICD-10-CM) - Vertigo   THERAPY DIAG:  Dizziness and giddiness  Unsteadiness on feet  ONSET DATE: 06/06/22 referral  Rationale for Evaluation and Treatment: Rehabilitation  SUBJECTIVE:   SUBJECTIVE STATEMENT: Patient reports being sent here by MD after ENT said everything was "okay" with her ears. ENT  wanted to do audiogram, but patient declined. States dizziness in the AM and with head turns to the R and when looking up. Per patient, MD laid her back and then moved her "head quick to the right and then to the left and it reproduced her dizziness." Reports that it feels like fluid is moving in her ears. Has been told that "the hairs in her ears needs to settle." Has had tinnitus in her ears for ~ a year, but can't say whether it's 1 ear more than the other. Also has started getting nosebleeds, but per ENT, only dryness is contributing to that.  Pt accompanied by: self  PERTINENT HISTORY: chronic sinusitis, migraines  PAIN:  Are you having pain? No  PRECAUTIONS: None  WEIGHT BEARING RESTRICTIONS: No  FALLS: Has patient fallen in last 6 months? No  LIVING ENVIRONMENT: Lives with: lives alone Lives in: House/apartment Stairs: "some" Has following equipment at home: None  PLOF: Independent  PATIENT GOALS: "to not be dizzy"  OBJECTIVE:  COGNITION: Overall cognitive status: Within functional limits for tasks assessed   SENSATION: WFL   POSTURE:  No Significant postural limitations  Cervical ROM:   WFL Active A/PROM (deg) eval  Flexion   Extension   Right lateral flexion   Left lateral flexion   Right rotation   Left rotation   (Blank rows = not tested)  STRENGTH: WFL  BED MOBILITY:  Independent, provokes dizziness  GAIT: Gait pattern: WFL  PATIENT SURVEYS:  FOTO 52; expected to be at 52  VESTIBULAR ASSESSMENT:  GENERAL OBSERVATION: NAD   SYMPTOM BEHAVIOR:  Subjective history: see above  Non-Vestibular symptoms: tinnitus  Type of dizziness: Spinning/Vertigo and "Funny feeling in the head"  Frequency: most mornings  Duration: 3-4 hours  Aggravating factors: Induced by position change: supine to sit, Induced by motion: looking up at the ceiling, turning body quickly, turning head quickly, and driving, and Worse in the morning  Relieving factors: head  stationary and rest  Progression of symptoms: unchanged  OCULOMOTOR EXAM:  Ocular Alignment: normal  Ocular ROM: No Limitations  Spontaneous Nystagmus: absent  Gaze-Induced Nystagmus: absent  Smooth Pursuits: intact  Saccades: intact    VESTIBULAR - OCULAR REFLEX:   Slow VOR: Normal  VOR Cancellation: Normal  Head-Impulse Test: HIT Right: positive HIT Left: positive  Dynamic Visual Acuity: to be assessed  Test of skew: (-) bilaterally   POSITIONAL TESTING: Right Dix-Hallpike: no nystagmus Left Dix-Hallpike: no nystagmus Right Roll Test: no nystagmus Left Roll Test: no nystagmus  MOTION SENSITIVITY:  Motion Sensitivity Quotient Intensity: 0 = none, 1 = Lightheaded, 2 = Mild, 3 = Moderate, 4 = Severe, 5 = Vomiting  Intensity  1. Sitting to supine 0  2. Supine to L side   3. Supine to R side   4. Supine to sitting 0  5. L Hallpike-Dix 0  6. Up from L  0  7. R Hallpike-Dix 1  8. Up from R  1  9. Sitting, head tipped to L knee   10. Head up from L knee   11. Sitting, head tipped to R knee   12. Head up from R knee   13. Sitting head turns x5   14.Sitting head nods x5   15. In stance, 180 turn to L    16. In stance, 180 turn to R     VESTIBULAR TREATMENT:                                                                                                   N/a eval   PATIENT EDUCATION: Education details: anatomy of ear and vestibular organ, exam results, PT POC Person educated: Patient Education method: Explanation Education comprehension: verbalized understanding and needs further education  HOME EXERCISE PROGRAM: To be provided GOALS: Goals reviewed with patient? Yes  SHORT TERM GOALS: = LTG based on PT POC  LONG TERM GOALS: Target date: 07/27/22  Pt will be independent with final HEP for improved symptom report Baseline: to be provided  Goal status: INITIAL  2.  Patient will demonstrate (-) positional testing to indicate resolution of BPPV  Baseline:  unable to reproduce on eval, but per patient-was (+) at MD office Goal status: INITIAL  3.  MSQ goal Baseline: to be completed Goal status: INITIAL  4.  DVA goal Baseline: to be assessed Goal status: INITIAL  5.  M-CTSIB goal Baseline: to be assessed Goal status: INITIAL  6. Patient will improve FOTO score to >/=  60 to demonstrate improved symptom report  Baseline: 52  Goal status: INITIAL   ASSESSMENT:  CLINICAL IMPRESSION: Patient is a 67 y.o. female who was seen today for physical therapy evaluation and treatment for dizziness. Per patient, had a positive dix hallpike maneuver at MD office; however, what patient described was not the dix hallpike maneuver. PT unable to recreate (+) dix hallpike maneuver in clinic today. 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 It is possible that patient has a vestibular hypofunction contributing to her symptoms. She reports hearing loss and tinnitus in B ears, but declined further ENT work up, per patient report. As well, patients subjective report of dizziness with head turns in single plane are more consistent with a hypofunction than BPPV. Given patients history of migraines, vestibular migraines cannot be ruled out. She would benefit from skilled PT services to address the above mentioned deficits.    OBJECTIVE IMPAIRMENTS: decreased knowledge of condition and dizziness.   ACTIVITY LIMITATIONS: bending, bed mobility, locomotion level, and caring for others  PARTICIPATION LIMITATIONS: interpersonal relationship, driving, shopping, community activity, and occupation  PERSONAL FACTORS: Behavior pattern, Past/current experiences, Time since onset of injury/illness/exacerbation, and 1-2 comorbidities: migraines, chronic sinusitis  are also affecting patient's functional outcome.   REHAB POTENTIAL: Fair unknown etiology  CLINICAL DECISION MAKING: Evolving/moderate complexity  EVALUATION COMPLEXITY: Moderate   PLAN:  PT  FREQUENCY: 1-2x/week  PT DURATION: 4 weeks  PLANNED INTERVENTIONS: Therapeutic exercises, Therapeutic activity, Neuromuscular re-education, Balance training, Gait training, Patient/Family education, Self Care, Joint mobilization, Stair training, Vestibular training, Canalith repositioning, Visual/preceptual remediation/compensation, DME instructions, Aquatic Therapy, Manual therapy, and Re-evaluation  PLAN FOR NEXT SESSION: MSQ, DVA, M-CTSIB, HEP   Debbora Dus, PT Debbora Dus, PT, DPT, CBIS  06/15/2022, 8:47 AM

## 2022-06-18 ENCOUNTER — Ambulatory Visit: Payer: Medicare Other | Admitting: Physical Therapy

## 2022-06-18 ENCOUNTER — Encounter: Payer: Self-pay | Admitting: Physical Therapy

## 2022-06-18 DIAGNOSIS — H8111 Benign paroxysmal vertigo, right ear: Secondary | ICD-10-CM | POA: Diagnosis not present

## 2022-06-18 DIAGNOSIS — R42 Dizziness and giddiness: Secondary | ICD-10-CM | POA: Diagnosis not present

## 2022-06-18 DIAGNOSIS — E1165 Type 2 diabetes mellitus with hyperglycemia: Secondary | ICD-10-CM | POA: Diagnosis not present

## 2022-06-18 DIAGNOSIS — R2681 Unsteadiness on feet: Secondary | ICD-10-CM

## 2022-06-18 DIAGNOSIS — E559 Vitamin D deficiency, unspecified: Secondary | ICD-10-CM | POA: Diagnosis not present

## 2022-06-18 NOTE — Therapy (Signed)
OUTPATIENT PHYSICAL THERAPY VESTIBULAR TREATMENT NOTE     Patient Name: Marcia Walker MRN: [id number] DOB:05-Sep-1955, 67 y.o., female Today's Date: 06/18/2022  END OF SESSION:  PT End of Session - 06/18/22 0858     Visit Number 2    Number of Visits 9    Date for PT Re-Evaluation 07/27/22    Authorization Type [employer] medicare    PT Start Time 0807    PT Stop Time 0850    PT Time Calculation (min) 43 min    Activity Tolerance Patient tolerated treatment well    Behavior During Therapy [facility] for tasks assessed/performed              Past Medical History:  Diagnosis Date   Felon of finger of right hand with lymphangitis 12/17/2011   GERD (gastroesophageal reflux disease)    Headache(784.0)    with allergies   Past Surgical History:  Procedure Laterality Date   I & D EXTREMITY  12/17/2011   Procedure: IRRIGATION AND DEBRIDEMENT EXTREMITY;  Surgeon: Schuyler Amor, MD;  Location: [place];  Service: Orthopedics;  Laterality: Right;  Right Ring Finger   left shouler manipulation  01/2010   OTHER SURGICAL HISTORY  15 yrs ago   cyst removed from end of spine   right shoulder manipulation 03/2011  03/2011   UTERINE FIBROID SURGERY  2007   Patient Active Problem List   Diagnosis Date Noted   Pain in right foot 05/04/2022   Trigger finger, left ring finger 05/04/2022   Low back pain 02/18/2020   Hemoperitoneum 08/04/2015   Abdominal pain in female 08/04/2015   S/P colonoscopy 08/04/2015   GERD (gastroesophageal reflux disease)    Gastroesophageal reflux disease with esophagitis     PCP: Latanya Presser, MD REFERRING PROVIDER: Latanya Presser, MD  REFERRING DIAG: R42 (ICD-10-CM) - Vertigo   THERAPY DIAG:  BPPV (benign paroxysmal positional vertigo), right  Unsteadiness on feet  ONSET DATE: 06/06/22 referral  Rationale for Evaluation and Treatment: Rehabilitation  SUBJECTIVE:   SUBJECTIVE STATEMENT: Patient reports she felt the dizziness this morning when she  turned her head to the right but has learned to keep her head still and it will go away;  pt states she had a really bad headache Friday afternoon, started around 3:00, and lasted a couple of hours; pt continues to c/o itching in her Rt ear Pt accompanied by: self  PERTINENT HISTORY: chronic sinusitis, migraines  PAIN:  Are you having pain? No  PRECAUTIONS: None  WEIGHT BEARING RESTRICTIONS: No  FALLS: Has patient fallen in last 6 months? No  LIVING ENVIRONMENT: Lives with: lives alone Lives in: House/apartment Stairs: "some" Has following equipment at home: None  PLOF: Independent  PATIENT GOALS: "to not be dizzy"  OBJECTIVE:    POSITIONAL TESTING:  Rt Dix-Hallpike test (+) with mild low intensity nystagmus noted in test position - with only 2-3 beats noted but with pt reporting vertigo Lt Dix-Hallpike test (-) with no nystagmus and no c/o vertigo in test position  Epley maneuver for Rt BPPV performed 2 reps with significant improvement noted on 2nd rep - pt had no nystagmus and no c/o vertigo in any test position on 2nd rep  Sit to Rt sidelying performed 5 reps; pt reported very mild dizziness on 1st 2 reps only, then no dizziness on reps 3-5; pt able to perform cervical extension in seated with no c/o vertigo with looking up  SVA - line 10;  DVA line 8 with  no c/o dizziness but with pt reporting need for eyes to refocus; pt stated her eye glass prescription was changed at her previous ophthalmology appt last summer - pt states he reduced strength of prescription  Pt amb. 25' with head turns horizontally with no c/o dizziness and no unsteadiness (at end of session)  PATIENT EDUCATION: Education details:  BPPV etiology with article from [place] given to pt;  instructions on how to perform Epley maneuver for self treatment - both written instructions and picture of Epley Person educated: Patient Education method: Explanation Education comprehension: verbalized understanding  and needs further education  HOME EXERCISE PROGRAM: Epley maneuver instructions (written and pictures given to patient)      GOALS: Goals reviewed with patient? Yes  SHORT TERM GOALS: = LTG based on PT POC  LONG TERM GOALS: Target date: 07/27/22  Pt will be independent with final HEP for improved symptom report Baseline: to be provided  Goal status: INITIAL  2.  Patient will demonstrate (-) positional testing to indicate resolution of BPPV  Baseline: unable to reproduce on eval, but per patient-was (+) at MD office Goal status: INITIAL  3.  MSQ goal Baseline: to be completed Goal status: INITIAL  4.  DVA goal Baseline: to be assessed Goal status: DEFERRED GOAL AS DVA WNL'S - 06-18-22  5.  M-CTSIB goal Baseline: to be assessed Goal status: INITIAL  6. Patient will improve FOTO score to >/= 60 to demonstrate improved symptom report  Baseline: 52  Goal status: INITIAL   ASSESSMENT:  CLINICAL IMPRESSION: Patient had (+) Rt Dix-Hallpike test with mild low intensity nystagmus with only 2-3 beats of Rt rotary nystagmus noted in test position.  Pt's symptoms are consistent with Rt BPPV posterior canalithiasis.  Epley was performed 2 reps with no nystagmus and no c/o vertigo in any position on 2nd rep.  DVA WNL's with a 2 line difference with no c/o dizziness upon completion of test.  Cont with POC.     OBJECTIVE IMPAIRMENTS: decreased knowledge of condition and dizziness.   ACTIVITY LIMITATIONS: bending, bed mobility, locomotion level, and caring for others  PARTICIPATION LIMITATIONS: interpersonal relationship, driving, shopping, community activity, and occupation  PERSONAL FACTORS: Behavior pattern, Past/current experiences, Time since onset of injury/illness/exacerbation, and 1-2 comorbidities: migraines, chronic sinusitis  are also affecting patient's functional outcome.   REHAB POTENTIAL: Fair unknown etiology  CLINICAL DECISION MAKING: Evolving/moderate  complexity  EVALUATION COMPLEXITY: Moderate   PLAN:  PT FREQUENCY: 1-2x/week  PT DURATION: 4 weeks  PLANNED INTERVENTIONS: Therapeutic exercises, Therapeutic activity, Neuromuscular re-education, Balance training, Gait training, Patient/Family education, Self Care, Joint mobilization, Stair training, Vestibular training, Canalith repositioning, Visual/preceptual remediation/compensation, DME instructions, Aquatic Therapy, Manual therapy, and Re-evaluation  PLAN FOR NEXT SESSION:   Recheck Rt BPPV:  MSQ, M-CTSIB, update HEP as appropriate   Cedrik Heindl, [place], PT   06/18/2022, 6:53 PM

## 2022-06-18 NOTE — Patient Instructions (Signed)
   How to Perform the Epley Maneuver The Epley maneuver is an exercise that relieves symptoms of vertigo. Vertigo is the feeling that you or your surroundings are moving when they are not. When you feel vertigo, you may feel like the room is spinning and may have trouble walking. The Epley maneuver is used for a type of vertigo caused by a calcium deposit in a part of the inner ear. The maneuver involves changing head positions to help the deposit move out of the area. You can do this maneuver at home whenever you have symptoms of vertigo. You can repeat it in 24 hours if your vertigo has not gone away. Even though the Epley maneuver may relieve your vertigo for a few weeks, it is possible that your symptoms will return. This maneuver relieves vertigo, but it does not relieve dizziness. What are the risks? If it is done correctly, the Epley maneuver is considered safe. Sometimes it can lead to dizziness or nausea that goes away after a short time. If you develop other symptoms--such as changes in vision, weakness, or numbness--stop doing the maneuver and call your health care provider. Supplies needed: A bed or table. A pillow. How to do the Epley maneuver     Sit on the edge of a bed or table with your back straight and your legs extended or hanging over the edge of the bed or table. Turn your head halfway toward the affected ear or side as told by your health care provider. Lie backward quickly with your head turned until you are lying flat on your back. Your head should dangle (head-hanging position). You may want to position a pillow under your shoulders. Hold this position for at least 30 seconds. If you feel dizzy or have symptoms of vertigo, continue to hold the position until the symptoms stop. Turn your head to the opposite direction until your unaffected ear is facing down. Your head should continue to dangle. Hold this position for at least 30 seconds. If you feel dizzy or have  symptoms of vertigo, continue to hold the position until the symptoms stop. Turn your whole body to the same side as your head so that you are positioned on your side. Your head will now be nearly facedown and no longer needs to dangle. Hold for at least 30 seconds. If you feel dizzy or have symptoms of vertigo, continue to hold the position until the symptoms stop. Sit back up. You can repeat the maneuver in 24 hours if your vertigo does not go away. Follow these instructions at home: For 24 hours after doing the Epley maneuver: Keep your head in an upright position. When lying down to sleep or rest, keep your head raised (elevated) with two or more pillows. Avoid excessive neck movements. Activity Do not drive or use machinery if you feel dizzy. After doing the Epley maneuver, return to your normal activities as told by your health care provider. Ask your health care provider what activities are safe for you. General instructions Drink enough fluid to keep your urine pale yellow. Do not drink alcohol. Take over-the-counter and prescription medicines only as told by your health care provider. Keep all follow-up visits. This is important. Preventing vertigo symptoms Ask your health care provider if there is anything you should do at home to prevent vertigo. He or she may recommend that you: Keep your head elevated with two or more pillows while you sleep. Do not sleep on the side of your   affected ear. Get up slowly from bed. Avoid sudden movements during the day. Avoid extreme head positions or movement, such as looking up or bending over. Contact a health care provider if: Your vertigo gets worse. You have other symptoms, including: Nausea. Vomiting. Headache. Get help right away if you: Have vision changes. Have a headache or neck pain that is severe or getting worse. Cannot stop vomiting. Have new numbness or weakness in any part of your body. These symptoms may represent a  serious problem that is an emergency. Do not wait to see if the symptoms will go away. Get medical help right away. Call your local emergency services (911 in the [place] Do not drive yourself to the hospital. Summary Vertigo is the feeling that you or your surroundings are moving when they are not. The Epley maneuver is an exercise that relieves symptoms of vertigo. If the Epley maneuver is done correctly, it is considered safe. This information is not intended to replace advice given to you by your health care provider. Make sure you discuss any questions you have with your health care provider. Document Revised: 02/17/2020 Document Reviewed: 02/17/2020 Elsevier Patient Education  2023 Elsevier Inc.    Self Treatment for Right Posterior / Anterior Canalithiasis    Sitting on bed: 1. Turn head 45 right. (a) Lie back slowly, shoulders on pillow, head on bed. (b) Hold _20___ seconds. 2. Keeping head on bed, turn head 90 left. Hold __20__ seconds. 3. Roll to left, head on 45 angle down toward bed. Hold _20___ seconds. 4. Sit up on left side of bed. Repeat __3__ times per session. Do __2__ sessions per day.  Copyright  VHI. All rights reserved.   

## 2022-06-25 ENCOUNTER — Encounter: Payer: Medicare Other | Admitting: Physical Therapy

## 2022-06-28 ENCOUNTER — Encounter: Payer: Medicare Other | Admitting: Physical Therapy

## 2022-07-05 ENCOUNTER — Encounter: Payer: Medicare Other | Admitting: Physical Therapy

## 2022-07-09 ENCOUNTER — Ambulatory Visit: Payer: Medicare Other | Admitting: Physical Therapy

## 2022-07-12 ENCOUNTER — Ambulatory Visit: Payer: Medicare Other

## 2022-07-13 DIAGNOSIS — E1165 Type 2 diabetes mellitus with hyperglycemia: Secondary | ICD-10-CM | POA: Diagnosis not present

## 2022-07-27 DIAGNOSIS — H8111 Benign paroxysmal vertigo, right ear: Secondary | ICD-10-CM | POA: Diagnosis not present

## 2022-07-27 DIAGNOSIS — E559 Vitamin D deficiency, unspecified: Secondary | ICD-10-CM | POA: Diagnosis not present

## 2022-07-27 DIAGNOSIS — E1165 Type 2 diabetes mellitus with hyperglycemia: Secondary | ICD-10-CM | POA: Diagnosis not present

## 2022-07-27 DIAGNOSIS — M19041 Primary osteoarthritis, right hand: Secondary | ICD-10-CM | POA: Diagnosis not present

## 2022-08-09 DIAGNOSIS — K080 Exfoliation of teeth due to systemic causes: Secondary | ICD-10-CM | POA: Diagnosis not present

## 2022-08-22 NOTE — Therapy (Unsigned)
[facility] [address] [place], [place], [address] Phone: [phone]   Fax:  [phone]  Patient Details  Name: Marcia Walker MRN: [id number] Date of Birth: [birth year] Referring Provider:  No ref. provider found  Encounter Date: 08/22/2022  PHYSICAL THERAPY DISCHARGE SUMMARY  Visits from Start of Care: 2  Current functional level related to goals / functional outcomes: Unable to be assessed as patient has not returned since last appt   Remaining deficits: Unable to be assessed as patient has not returned since last appt   Education / Equipment: PT POC, HEP, exam findings   Patient agrees to discharge. Patient goals were  Unable to be assessed as patient has not returned since last appt . Patient is being discharged due to not returning since the last visit.  Westley Foots, PT, DPT, CBIS 08/22/2022, 10:10 AM  [facility] [address] [place], [place], [address] Phone: [phone]   Fax:  [phone]

## 2022-08-30 ENCOUNTER — Encounter: Payer: Medicare Other | Attending: Internal Medicine | Admitting: Dietician

## 2022-08-30 ENCOUNTER — Telehealth: Payer: Self-pay | Admitting: Physician Assistant

## 2022-08-30 VITALS — Ht 62.0 in | Wt 109.0 lb

## 2022-08-30 DIAGNOSIS — E119 Type 2 diabetes mellitus without complications: Secondary | ICD-10-CM | POA: Diagnosis not present

## 2022-08-30 NOTE — Telephone Encounter (Signed)
Received vm from patient needing to get copy of medical records. IC,lmvm advising need to sign an authorization for release of records. [phone]

## 2022-08-30 NOTE — Progress Notes (Signed)
Medical Nutrition Therapy  Appointment Start time:  [phone]  Appointment End time:  79 Patient is here today alone.  Primary concerns today: Patient would like to learn to eat to better help with her health.  Referral diagnosis: Type 2 Diabetes Preferred learning style: no preference indicated Learning readiness:  change in progress   NUTRITION ASSESSMENT   Anthropometrics  62" 109 lbs  08/30/2022 112 lbs  UBW 115 lbs maximum recent weight  Clinical Medical Hx: Type 2 Diabetes, vitamin D deficiency, HLD, GERD, prediabetes, history of c.diff, punctured colonoscopy (2017), IBS Medications/supplements : biotin, vitamin B-12, vitamin D Labs:  Vitamin D 19 on 06/18/2022, A1C 6.2% 2019  (no current A1C found) Notable Signs/Symptoms: missed meals  Fasting blood glucose 115 per patient record and in the 120's post meal  Lifestyle & Dietary Hx Patient lives alone. 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  All environments and different shifts.  She enjoys it.    Acidic fruits make her itch.  Acidophilous has helped. Eats vegetarian 3 days per week.  Loves vegetables and eats increased seafood.  Dislikes red meat and eats rarely. Estimated daily fluid intake: patient focuses to drink adequate amounts usually but [place] not drink [place] Sleep: 5-7 hours per night - high energy Stress / self-care: Very high - Mother with a memory issue and father has had a stroke.   Current average weekly physical activity: pickle ball 2 times per week, has a trainer and works out before work.  Lifts 2 30 lb bags for work, standing all day at work and increased walking  24-Hr Dietary Recall First Meal: Oatmeal, nuts, flax seed, hemp seeds, blueberries, 1 slice Ezekiel toast OR smoothie (banana, PB, avocado, kale, chocolate, ice, applesauce, MRE lite protein powder) OR apple, glucerna, PB crackers if traveling OR  chik fil-A breakfast and egg wrap OR veggie bow Snack: none Second Meal: skips at times,  Snack: protein bar Third Meal: hamburger Snack: pecans with whipped cream, chocolate Beverages: water, gatorade, occasional sip of Pepsi (Doesn't drink on set as there are no restrooms.)  NUTRITION DIAGNOSIS  NB-1.1 Food and nutrition-related knowledge deficit As related to balance of carbohydrates, protein, and fat.  As evidenced by diet hx and patient report.   NUTRITION INTERVENTION  Nutrition education (E-1) on the following topics:  Counseling and diabetes education initiated.  Discussed My Royetta Crochet, the 3 main macronutrients and implications on blood glucose Discussed label reading Discussed benefits of increased activity and tips for any barriers Discussed basic physiology of Diabetes Instructed on blood glucose target, BG ranges pre and post meals, and A1C goals Discussed her lifestyle, importance of stress control and self care  Handouts Provided Include  How to Thrive:  A Guide for Your Journey with Diabetes by the ADA Label reading Diabetes Resources Planning Healthy Meals  Learning Style & Readiness for Change Teaching method utilized: Visual & Auditory  Demonstrated degree of understanding via: Teach Back  Barriers to learning/adherence to lifestyle change: work schedule  Goals Established by Pt  Balanced meals and snacks   Stress control Continue water and avoid sweetened and artificial sweeteners Stay active  Orgain Simple (protein shake option)  Fruits & Vegetables: Aim to fill half your plate with a variety of fruits and vegetables. They are rich in vitamins, minerals, and fiber, and can help reduce the risk of chronic diseases. Choose  a colorful assortment of fruits and vegetables to ensure you get a wide range of nutrients. Grains and Starches: Make at least half of your grain choices whole grains, such as brown rice, whole wheat bread, and oats. Whole grains provide  fiber, which aids in digestion and healthy cholesterol levels. Aim for whole forms of starchy vegetables such as potatoes, sweet potatoes, beans, peas, and corn, which are fiber rich and provide many vitamins and minerals.  Protein: Incorporate lean sources of protein, such as poultry, fish, beans, nuts, and seeds, into your meals. Protein is essential for building and repairing tissues, staying full, balancing blood sugar, as well as supporting immune function. Dairy: Include low-fat or fat-free dairy products like milk, yogurt, and cheese in your diet. Dairy foods are excellent sources of calcium and vitamin D, which are crucial for bone health.  Physical Activity: Aim for 60 minutes of physical activity daily. Regular physical activity promotes overall health-including helping to reduce risk for heart disease and diabetes, promoting mental health, and helping [place] sleep better.    MONITORING & EVALUATION Dietary intake, weekly physical activity, and label reading in 3-4 months.  Next Steps  Patient is to call for questions.

## 2022-08-30 NOTE — Patient Instructions (Addendum)
Fruits & Vegetables: Aim to fill half your plate with a variety of fruits and vegetables. They are rich in vitamins, minerals, and fiber, and can help reduce the risk of chronic diseases. Choose a colorful assortment of fruits and vegetables to ensure you get a wide range of nutrients. Grains and Starches: Make at least half of your grain choices whole grains, such as brown rice, whole wheat bread, and oats. Whole grains provide fiber, which aids in digestion and healthy cholesterol levels. Aim for whole forms of starchy vegetables such as potatoes, sweet potatoes, beans, peas, and corn, which are fiber rich and provide many vitamins and minerals.  Protein: Incorporate lean sources of protein, such as poultry, fish, beans, nuts, and seeds, into your meals. Protein is essential for building and repairing tissues, staying full, balancing blood sugar, as well as supporting immune function. Dairy: Include low-fat or fat-free dairy products like milk, yogurt, and cheese in your diet. Dairy foods are excellent sources of calcium and vitamin D, which are crucial for bone health.  Physical Activity: Aim for 60 minutes of physical activity daily. Regular physical activity promotes overall health-including helping to reduce risk for heart disease and diabetes, promoting mental health, and helping [place] sleep better.     Balanced meals and snacks   Stress control Continue water and avoid sweetened and artificial sweeteners Stay active  Orgain Simple (protein shake option)

## 2022-09-13 DIAGNOSIS — M65341 Trigger finger, right ring finger: Secondary | ICD-10-CM | POA: Diagnosis not present

## 2022-10-11 DIAGNOSIS — M65341 Trigger finger, right ring finger: Secondary | ICD-10-CM | POA: Diagnosis not present

## 2022-10-11 DIAGNOSIS — M65331 Trigger finger, right middle finger: Secondary | ICD-10-CM | POA: Diagnosis not present

## 2022-10-19 DIAGNOSIS — E559 Vitamin D deficiency, unspecified: Secondary | ICD-10-CM | POA: Diagnosis not present

## 2022-10-22 DIAGNOSIS — M79671 Pain in right foot: Secondary | ICD-10-CM | POA: Diagnosis not present

## 2022-10-22 DIAGNOSIS — L600 Ingrowing nail: Secondary | ICD-10-CM | POA: Diagnosis not present

## 2022-10-22 DIAGNOSIS — E1165 Type 2 diabetes mellitus with hyperglycemia: Secondary | ICD-10-CM | POA: Diagnosis not present

## 2022-10-22 DIAGNOSIS — Z0000 Encounter for general adult medical examination without abnormal findings: Secondary | ICD-10-CM | POA: Diagnosis not present

## 2022-10-22 DIAGNOSIS — T733XXD Exhaustion due to excessive exertion, subsequent encounter: Secondary | ICD-10-CM | POA: Diagnosis not present

## 2022-11-15 ENCOUNTER — Other Ambulatory Visit: Payer: Self-pay | Admitting: Podiatry

## 2022-11-15 ENCOUNTER — Encounter: Payer: Self-pay | Admitting: Podiatry

## 2022-11-15 ENCOUNTER — Ambulatory Visit (INDEPENDENT_AMBULATORY_CARE_PROVIDER_SITE_OTHER): Payer: Medicare Other

## 2022-11-15 ENCOUNTER — Ambulatory Visit: Payer: Medicare Other | Admitting: Podiatry

## 2022-11-15 DIAGNOSIS — M7751 Other enthesopathy of right foot: Secondary | ICD-10-CM

## 2022-11-15 DIAGNOSIS — M779 Enthesopathy, unspecified: Secondary | ICD-10-CM

## 2022-11-15 DIAGNOSIS — M79671 Pain in right foot: Secondary | ICD-10-CM

## 2022-11-15 DIAGNOSIS — M722 Plantar fascial fibromatosis: Secondary | ICD-10-CM

## 2022-11-15 MED ORDER — TRIAMCINOLONE ACETONIDE 10 MG/ML IJ SUSP
10.0000 mg | Freq: Once | INTRAMUSCULAR | Status: AC
  Administered 2022-11-15: 10 mg via INTRA_ARTICULAR

## 2022-11-15 NOTE — Progress Notes (Signed)
Subjective:   Patient ID: Marcia Walker, female   DOB: 67 y.o.   MRN: [id number]   HPI Patient states she has had a several year history of discomfort in the right forefoot that she is using Voltaren gel on as recommended by orthopedic physician with history of inflammation in the heel and then 1 month ago she dropped a lamp on her foot in the same area of the top and that is been very tender.  Patient also has possible ingrown toenails does not smoke likes to be active   Review of Systems  All other systems reviewed and are negative.       Objective:  Physical Exam Vitals and nursing note reviewed.  Constitutional:      Appearance: She is well-developed.  Pulmonary:     Effort: Pulmonary effort is normal.  Musculoskeletal:        General: Normal range of motion.  Skin:    General: Skin is warm.  Neurological:     Mental Status: She is alert.     Neurovascular status intact muscle strength adequate range of motion adequate with patient found to have significant discomfort dorsum of the right foot which is difficult to separate from the injury versus the chronic inflammation pain she has had and has possible quite a bit of discomfort in the third metatarsal phalangeal joint right foot and has mild discomfort heel flatfoot deformity mild incurvation medial borders hallux bilateral.  Good digital perfusion well-oriented x 3     Assessment:  Appears to be inflammatory tendinitis dorsal right cannot rule out fracture with possible inflammation of the third MPJ right and moderate plantar fasciitis     Plan:  H&P all conditions reviewed, to try to reduce the dorsal inflammatory process I explained injection risk of injection and patient wants this done.  Sterile prep injected the right extensor complex distally 3 mg dexamethasone Kenalog 5 mg Xylocaine advised on ice therapy and also possibly will work on joint she definitely will need orthotics custom long-term  X-rays indicate  significant depression of the arch no signs of arthritis no signs of stress fracture

## 2022-11-24 DIAGNOSIS — R7303 Prediabetes: Secondary | ICD-10-CM | POA: Diagnosis not present

## 2022-11-24 DIAGNOSIS — H43393 Other vitreous opacities, bilateral: Secondary | ICD-10-CM | POA: Diagnosis not present

## 2022-11-28 DIAGNOSIS — H524 Presbyopia: Secondary | ICD-10-CM | POA: Diagnosis not present

## 2022-12-02 ENCOUNTER — Other Ambulatory Visit: Payer: Self-pay

## 2022-12-02 ENCOUNTER — Encounter (HOSPITAL_COMMUNITY): Payer: Self-pay | Admitting: Emergency Medicine

## 2022-12-02 ENCOUNTER — Emergency Department (HOSPITAL_COMMUNITY)
Admission: EM | Admit: 2022-12-02 | Discharge: 2022-12-03 | Disposition: A | Discharge status: Home / Self Care | Payer: Medicare Other | Attending: Emergency Medicine | Admitting: Emergency Medicine

## 2022-12-02 DIAGNOSIS — R0789 Other chest pain: Secondary | ICD-10-CM | POA: Diagnosis not present

## 2022-12-02 DIAGNOSIS — M25512 Pain in left shoulder: Secondary | ICD-10-CM | POA: Insufficient documentation

## 2022-12-02 DIAGNOSIS — S20212A Contusion of left front wall of thorax, initial encounter: Secondary | ICD-10-CM

## 2022-12-02 DIAGNOSIS — M19042 Primary osteoarthritis, left hand: Secondary | ICD-10-CM | POA: Diagnosis not present

## 2022-12-02 DIAGNOSIS — Y92410 Unspecified street and highway as the place of occurrence of the external cause: Secondary | ICD-10-CM | POA: Insufficient documentation

## 2022-12-02 DIAGNOSIS — R0781 Pleurodynia: Secondary | ICD-10-CM | POA: Diagnosis not present

## 2022-12-02 DIAGNOSIS — Z041 Encounter for examination and observation following transport accident: Secondary | ICD-10-CM | POA: Diagnosis not present

## 2022-12-02 DIAGNOSIS — M79642 Pain in left hand: Secondary | ICD-10-CM | POA: Diagnosis not present

## 2022-12-02 DIAGNOSIS — M419 Scoliosis, unspecified: Secondary | ICD-10-CM | POA: Diagnosis not present
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
## 2022-12-03 ENCOUNTER — Emergency Department (HOSPITAL_COMMUNITY): Payer: Medicare Other

## 2022-12-03 DIAGNOSIS — M419 Scoliosis, unspecified: Secondary | ICD-10-CM | POA: Diagnosis not present

## 2022-12-03 DIAGNOSIS — M79642 Pain in left hand: Secondary | ICD-10-CM | POA: Diagnosis not present

## 2022-12-03 DIAGNOSIS — M19042 Primary osteoarthritis, left hand: Secondary | ICD-10-CM | POA: Diagnosis not present

## 2022-12-03 DIAGNOSIS — Z041 Encounter for examination and observation following transport accident: Secondary | ICD-10-CM | POA: Diagnosis not present

## 2022-12-03 DIAGNOSIS — R0781 Pleurodynia: Secondary | ICD-10-CM | POA: Diagnosis not present

## 2022-12-03 MED ORDER — ACETAMINOPHEN 325 MG PO TABS
650.0000 mg | ORAL_TABLET | Freq: Once | ORAL | Status: AC
  Administered 2022-12-03: 650 mg via ORAL
  Filled 2022-12-03: qty 2
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
## 2022-12-03 NOTE — Discharge Instructions (Addendum)
You were seen in ER today for left shoulder and chest wall pain.   Please alternate Tylenol and Ibuprofen for pain. You can also use ice and heat.   Continue to use incentive spirometer for three weeks.  Follow up with orthopedics if symptoms continue.   Return to ER with worsening symptoms.

## 2022-12-03 NOTE — ED Notes (Signed)
Informed by ER tech at this time that patient and spouse left ER, stated they did not want to wait for sling application. Pt declined vs and medical staff was unable to provide written d/c instructions at this time.

## 2022-12-06 ENCOUNTER — Ambulatory Visit: Payer: Medicare Other | Admitting: Podiatry

## 2022-12-06 ENCOUNTER — Other Ambulatory Visit: Payer: Self-pay | Admitting: Family Medicine

## 2022-12-06 DIAGNOSIS — R0781 Pleurodynia: Secondary | ICD-10-CM | POA: Diagnosis not present

## 2022-12-06 DIAGNOSIS — M25512 Pain in left shoulder: Secondary | ICD-10-CM | POA: Diagnosis not present

## 2022-12-06 DIAGNOSIS — R42 Dizziness and giddiness: Secondary | ICD-10-CM | POA: Diagnosis not present

## 2022-12-10 ENCOUNTER — Encounter: Payer: Self-pay | Admitting: Family Medicine

## 2022-12-12 ENCOUNTER — Ambulatory Visit: Payer: Medicare Other | Admitting: Podiatry

## 2022-12-12 ENCOUNTER — Encounter: Payer: Self-pay | Admitting: Podiatry

## 2022-12-12 ENCOUNTER — Ambulatory Visit
Admission: RE | Admit: 2022-12-12 | Discharge: 2022-12-12 | Disposition: A | Discharge status: Home / Self Care | Payer: Medicare Other | Source: Ambulatory Visit | Attending: Family Medicine | Admitting: Family Medicine

## 2022-12-12 DIAGNOSIS — M779 Enthesopathy, unspecified: Secondary | ICD-10-CM | POA: Diagnosis not present

## 2022-12-12 DIAGNOSIS — R4789 Other speech disturbances: Secondary | ICD-10-CM | POA: Diagnosis not present

## 2022-12-12 DIAGNOSIS — M7751 Other enthesopathy of right foot: Secondary | ICD-10-CM
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
## 2022-12-13 ENCOUNTER — Encounter: Payer: Medicare Other | Attending: Internal Medicine | Admitting: Dietician

## 2022-12-13 ENCOUNTER — Encounter: Payer: Self-pay | Admitting: Dietician

## 2022-12-13 DIAGNOSIS — Z713 Dietary counseling and surveillance: Secondary | ICD-10-CM | POA: Diagnosis not present

## 2022-12-13 DIAGNOSIS — E119 Type 2 diabetes mellitus without complications: Secondary | ICD-10-CM

## 2022-12-13 DIAGNOSIS — E1165 Type 2 diabetes mellitus with hyperglycemia: Secondary | ICD-10-CM | POA: Insufficient documentation

## 2022-12-13 NOTE — Progress Notes (Signed)
Medical Nutrition Therapy  Appointment Start time:  [id number]  Appointment End time:  1025 Patient is here today alone.  She states that she has been having more problems with her stomach - diarrhea.  She states that this is due to stress. She states that she has stopped Avocado but this did not improve. Fasting blood glucose 96-103 or 112 when she eats a sweet the night before.  Primary concerns today: Patient would like to learn to eat to better help with her health.  Referral diagnosis: Type 2 Diabetes Preferred learning style: no preference indicated Learning readiness:  change in progress   NUTRITION ASSESSMENT   Anthropometrics  62" 112 lbs 12/13/2022 109 lbs  08/30/2022 112 lbs  UBW 115 lbs maximum recent weight.  States that she is working hard to gain back to this weight.  Clinical Medical Hx: Type 2 Diabetes, vitamin D deficiency, HLD, GERD, prediabetes, history of c.diff, punctured colonoscopy (2017), IBS Medications/supplements : biotin, vitamin B-12, vitamin D Labs:  Vitamin D 19 on 06/18/2022, A1C 6.2% 2019  (no current A1C found) Notable Signs/Symptoms: missed meals  Fasting blood glucose 115 per patient record and in the 120's post meal  Lifestyle & Dietary Hx She states that mom may have undiagnosed Alzheimer's.  Father is s/p stroke and is currently living with patient. 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  All environments and different shifts.  She enjoys it.  She does not drink during the day when there is not an available bathroom.  Acidic fruits make her itch.  Acidophilous has helped. Eats vegetarian 3 days per week.  Loves vegetables and eats increased seafood.  Dislikes red meat and eats rarely. Estimated daily fluid intake: patient focuses to drink adequate amounts usually but [place] not drink [place] Sleep: 5-7 hours per night - high energy Stress /  self-care: Very high - Mother with a memory issue and father has had a stroke.   Current average weekly physical activity: pickle ball 2 times per week, has a trainer and works out before work.  Lifts 2 30 lb bags for work, standing all day at work and increased walking >10,000 steps at times - exercises 30 minutes most mornings  24-Hr Dietary Recall First Meal: Oatmeal, nuts, flax seed, hemp seeds, blueberries, 1 slice Ezekiel toast OR smoothie (banana, PB, avocado, kale, chocolate, ice, applesauce, MRE lite protein powder) OR apple, glucerna, PB crackers if traveling OR chik fil-A breakfast and egg wrap OR veggie bowl OR protein shake if no time for breakfast Snack: none Second Meal: skips at times - it depends what work orders Snack: protein bar Third Meal: green beans, yellow squash, liver and onions, 1 T yellow rice with gravy Snack: pecans with whipped cream, chocolate Beverages: water, gatorade (Doesn't drink on set as there are no restrooms.)  NUTRITION DIAGNOSIS  NB-1.1 Food and nutrition-related knowledge deficit As related to balance of carbohydrates, protein, and fat.  As evidenced by diet hx and patient report.   NUTRITION INTERVENTION  Nutrition education (E-1) on the following topics: Continued from initial visit Counseling and diabetes education initiated.  Discussed My Royetta Crochet, the 3 main macronutrients and implications on blood glucose Discussed label reading Discussed benefits of increased activity and tips for any barriers Discussed basic physiology of Diabetes Instructed on blood glucose target, BG ranges pre and post meals, and A1C goals Discussed her  lifestyle, importance of stress control and self care  Handouts Provided Include  How to Thrive:  A Guide for Your Journey with Diabetes by the ADA Label reading Diabetes Resources Planning Healthy Meals  Learning Style & Readiness for Change Teaching method utilized: Visual & Auditory  Demonstrated degree of  understanding via: Teach Back  Barriers to learning/adherence to lifestyle change: work schedule  Goals Established by Pt  Balanced meals and snacks   Stress control Continue water and avoid sweetened and artificial sweeteners Stay active  Orgain Simple (protein shake option)  Fruits & Vegetables: Aim to fill half your plate with a variety of fruits and vegetables. They are rich in vitamins, minerals, and fiber, and can help reduce the risk of chronic diseases. Choose a colorful assortment of fruits and vegetables to ensure you get a wide range of nutrients. Grains and Starches: Make at least half of your grain choices whole grains, such as brown rice, whole wheat bread, and oats. Whole grains provide fiber, which aids in digestion and healthy cholesterol levels. Aim for whole forms of starchy vegetables such as potatoes, sweet potatoes, beans, peas, and corn, which are fiber rich and provide many vitamins and minerals.  Protein: Incorporate lean sources of protein, such as poultry, fish, beans, nuts, and seeds, into your meals. Protein is essential for building and repairing tissues, staying full, balancing blood sugar, as well as supporting immune function. Dairy: Include low-fat or fat-free dairy products like milk, yogurt, and cheese in your diet. Dairy foods are excellent sources of calcium and vitamin D, which are crucial for bone health.  Physical Activity: Aim for 60 minutes of physical activity daily. Regular physical activity promotes overall health-including helping to reduce risk for heart disease and diabetes, promoting mental health, and helping [place] sleep better.    MONITORING & EVALUATION Dietary intake, weekly physical activity, and label readingprn  Next Steps  Patient is to call for questions.

## 2022-12-14 DIAGNOSIS — S39012A Strain of muscle, fascia and tendon of lower back, initial encounter: Secondary | ICD-10-CM | POA: Diagnosis not present

## 2022-12-18 DIAGNOSIS — R42 Dizziness and giddiness: Secondary | ICD-10-CM | POA: Diagnosis not present

## 2022-12-18 DIAGNOSIS — M25512 Pain in left shoulder: Secondary | ICD-10-CM | POA: Diagnosis not present

## 2022-12-18 DIAGNOSIS — R0781 Pleurodynia: Secondary | ICD-10-CM | POA: Diagnosis not present

## 2022-12-18 DIAGNOSIS — R479 Unspecified speech disturbances: Secondary | ICD-10-CM | POA: Diagnosis not present

## 2022-12-19 DIAGNOSIS — M542 Cervicalgia: Secondary | ICD-10-CM | POA: Diagnosis not present

## 2022-12-19 DIAGNOSIS — R262 Difficulty in walking, not elsewhere classified: Secondary | ICD-10-CM | POA: Diagnosis not present

## 2022-12-19 DIAGNOSIS — M546 Pain in thoracic spine: Secondary | ICD-10-CM | POA: Diagnosis not present

## 2022-12-19 DIAGNOSIS — R531 Weakness: Secondary | ICD-10-CM | POA: Diagnosis not present

## 2022-12-20 ENCOUNTER — Telehealth: Payer: Self-pay

## 2022-12-20 NOTE — Telephone Encounter (Signed)
Transition Care Management Unsuccessful Follow-up Telephone Call  Date of discharge and from where:  Marcia Walker 9/2  Attempts:  1st Attempt  Reason for unsuccessful TCM follow-up call:  No answer   [facility]  [facility] Guide, Phone: [phone] Website: [email or web address]

## 2022-12-21 ENCOUNTER — Telehealth: Payer: Self-pay

## 2022-12-21 NOTE — Telephone Encounter (Signed)
Transition Care Management Follow-up Telephone Call Date of discharge and from where: [facility] 9/2 How have you been since you were released from the hospital? Patient has been following up with PCP with concerns Any questions or concerns? No  Items Reviewed: Did the pt receive and understand the discharge instructions provided? Yes  Medications obtained and verified? Yes  Other? No  Any new allergies since your discharge? No  Dietary orders reviewed? No Do you have support at home? Yes    Follow up appointments reviewed:  PCP Hospital f/u appt confirmed? Yes  Scheduled to see  on  @ . Specialist Hospital f/u appt confirmed? No  Scheduled to see  on  @ . Are transportation arrangements needed? No  If their condition worsens, is the pt aware to call PCP or go to the Emergency Dept.? Yes Was the patient provided with contact information for the PCP's office or ED? Yes Was to pt encouraged to call back with questions or concerns? Yes

## 2022-12-24 DIAGNOSIS — M546 Pain in thoracic spine: Secondary | ICD-10-CM | POA: Diagnosis not present

## 2022-12-24 DIAGNOSIS — R531 Weakness: Secondary | ICD-10-CM | POA: Diagnosis not present

## 2022-12-24 DIAGNOSIS — M25512 Pain in left shoulder: Secondary | ICD-10-CM | POA: Diagnosis not present

## 2022-12-24 DIAGNOSIS — R262 Difficulty in walking, not elsewhere classified: Secondary | ICD-10-CM | POA: Diagnosis not present

## 2022-12-25 DIAGNOSIS — H11122 Conjunctival concretions, left eye: Secondary | ICD-10-CM | POA: Diagnosis not present

## 2022-12-28 DIAGNOSIS — R262 Difficulty in walking, not elsewhere classified: Secondary | ICD-10-CM | POA: Diagnosis not present

## 2022-12-28 DIAGNOSIS — M542 Cervicalgia: Secondary | ICD-10-CM | POA: Diagnosis not present

## 2022-12-28 DIAGNOSIS — R531 Weakness: Secondary | ICD-10-CM | POA: Diagnosis not present

## 2022-12-28 DIAGNOSIS — M546 Pain in thoracic spine: Secondary | ICD-10-CM | POA: Diagnosis not present

## 2023-01-08 DIAGNOSIS — M65331 Trigger finger, right middle finger: Secondary | ICD-10-CM | POA: Diagnosis not present

## 2023-01-08 DIAGNOSIS — M65341 Trigger finger, right ring finger: Secondary | ICD-10-CM | POA: Diagnosis not present

## 2023-01-11 DIAGNOSIS — M25512 Pain in left shoulder: Secondary | ICD-10-CM | POA: Diagnosis not present

## 2023-01-11 DIAGNOSIS — R262 Difficulty in walking, not elsewhere classified: Secondary | ICD-10-CM | POA: Diagnosis not present

## 2023-01-11 DIAGNOSIS — M542 Cervicalgia: Secondary | ICD-10-CM | POA: Diagnosis not present

## 2023-01-11 DIAGNOSIS — R531 Weakness: Secondary | ICD-10-CM | POA: Diagnosis not present

## 2023-01-14 DIAGNOSIS — R262 Difficulty in walking, not elsewhere classified: Secondary | ICD-10-CM | POA: Diagnosis not present

## 2023-01-14 DIAGNOSIS — M546 Pain in thoracic spine: Secondary | ICD-10-CM | POA: Diagnosis not present

## 2023-01-14 DIAGNOSIS — R531 Weakness: Secondary | ICD-10-CM | POA: Diagnosis not present

## 2023-01-14 DIAGNOSIS — R197 Diarrhea, unspecified: Secondary | ICD-10-CM | POA: Diagnosis not present

## 2023-01-14 DIAGNOSIS — M542 Cervicalgia: Secondary | ICD-10-CM | POA: Diagnosis not present

## 2023-01-18 DIAGNOSIS — R531 Weakness: Secondary | ICD-10-CM | POA: Diagnosis not present

## 2023-01-18 DIAGNOSIS — M25512 Pain in left shoulder: Secondary | ICD-10-CM | POA: Diagnosis not present

## 2023-01-18 DIAGNOSIS — M542 Cervicalgia: Secondary | ICD-10-CM | POA: Diagnosis not present

## 2023-01-18 DIAGNOSIS — R262 Difficulty in walking, not elsewhere classified: Secondary | ICD-10-CM | POA: Diagnosis not present

## 2023-01-22 DIAGNOSIS — M542 Cervicalgia: Secondary | ICD-10-CM | POA: Diagnosis not present

## 2023-01-22 DIAGNOSIS — M546 Pain in thoracic spine: Secondary | ICD-10-CM | POA: Diagnosis not present

## 2023-01-22 DIAGNOSIS — R531 Weakness: Secondary | ICD-10-CM | POA: Diagnosis not present

## 2023-01-22 DIAGNOSIS — Z1231 Encounter for screening mammogram for malignant neoplasm of breast: Secondary | ICD-10-CM | POA: Diagnosis not present

## 2023-01-22 DIAGNOSIS — R262 Difficulty in walking, not elsewhere classified: Secondary | ICD-10-CM | POA: Diagnosis not present

## 2023-01-25 DIAGNOSIS — R0781 Pleurodynia: Secondary | ICD-10-CM | POA: Diagnosis not present

## 2023-01-25 DIAGNOSIS — M25512 Pain in left shoulder: Secondary | ICD-10-CM | POA: Diagnosis not present

## 2023-01-25 DIAGNOSIS — K580 Irritable bowel syndrome with diarrhea: Secondary | ICD-10-CM | POA: Diagnosis not present

## 2023-01-25 DIAGNOSIS — M545 Low back pain, unspecified: Secondary | ICD-10-CM | POA: Diagnosis not present

## 2023-01-25 DIAGNOSIS — M546 Pain in thoracic spine: Secondary | ICD-10-CM | POA: Diagnosis not present

## 2023-01-25 DIAGNOSIS — M542 Cervicalgia: Secondary | ICD-10-CM | POA: Diagnosis not present

## 2023-01-30 ENCOUNTER — Encounter: Payer: Self-pay | Admitting: Podiatry

## 2023-01-30 ENCOUNTER — Ambulatory Visit: Payer: Medicare Other | Admitting: Podiatry

## 2023-01-30 DIAGNOSIS — M779 Enthesopathy, unspecified: Secondary | ICD-10-CM

## 2023-01-30 DIAGNOSIS — L600 Ingrowing nail: Secondary | ICD-10-CM | POA: Diagnosis not present

## 2023-01-30 NOTE — Progress Notes (Signed)
Subjective:   Patient ID: Marcia Walker, female   DOB: 67 y.o.   MRN: [id number]   HPI Patient states she was doing great up till 2 days ago and states that she wore the wrong shoe and developed some dorsal pain and also has a lot of pain that been continuous in her right big toe medial border over left   ROS      Objective:  Physical Exam  Mild discomfort dorsal tendon complex which is more upon deep palpation with incurvation of the right hallux medial border painful when pressed slight redness noted     Assessment:  Mild reoccurrence tendinitis ingrown toenail deformity right     Plan:  H&P reviewed both and I do think the ingrown toenail should be corrected with the nail border removed patient wants this done but is going to come in on Friday due to her schedule but I educated her on procedure risk and she had numerous questions answered.  Do not recommend current treatment of the tendinitis

## 2023-02-01 ENCOUNTER — Ambulatory Visit: Payer: Medicare Other | Admitting: Podiatry

## 2023-02-01 ENCOUNTER — Encounter: Payer: Self-pay | Admitting: Podiatry

## 2023-02-01 VITALS — Ht 62.0 in | Wt 110.0 lb

## 2023-02-01 DIAGNOSIS — L600 Ingrowing nail: Secondary | ICD-10-CM

## 2023-02-01 NOTE — Progress Notes (Signed)
Subjective:   Patient ID: Marcia Walker, female   DOB: 67 y.o.   MRN: [id number]   HPI Patient presents for correction of chronic ingrown toenail deformity right big toe that is been very tender with history on the left also but not to the same degree   ROS      Objective:  Physical Exam  Neurovascular status intact incurvated medial border right big toe painful when pressed inability to wear shoe gear with degree of comfort with slight irritation of the tissue but no erythema edema drainage     Assessment:  Ingrown toenail deformity chronic right big toe     Plan:  H&P reviewed recommended correction explained procedure risk patient signed consent form and I went ahead today and infiltrated the right big toe 60 mg like Marcaine mixture sterile prep done sterile instrumentation removal of the medial border exposed the matrix applied chemical agent 3 applications 30 seconds phenol for permanent eradication of the medial bed and applied alcohol lavage afterwards and sterile dressing.  Gave instructions on soaks wear dressing 24 hours take it off earlier if throbbing were to

## 2023-02-01 NOTE — Patient Instructions (Signed)

## 2023-02-07 ENCOUNTER — Telehealth: Payer: Self-pay | Admitting: Podiatry

## 2023-02-07 ENCOUNTER — Ambulatory Visit: Payer: Medicare Other | Admitting: Podiatry

## 2023-02-07 ENCOUNTER — Encounter: Payer: Self-pay | Admitting: Podiatry

## 2023-02-07 VITALS — Ht 62.0 in | Wt 110.0 lb

## 2023-02-07 DIAGNOSIS — E1165 Type 2 diabetes mellitus with hyperglycemia: Secondary | ICD-10-CM | POA: Diagnosis not present

## 2023-02-07 DIAGNOSIS — E785 Hyperlipidemia, unspecified: Secondary | ICD-10-CM | POA: Diagnosis not present

## 2023-02-07 DIAGNOSIS — Z0001 Encounter for general adult medical examination with abnormal findings: Secondary | ICD-10-CM | POA: Diagnosis not present

## 2023-02-07 DIAGNOSIS — L03032 Cellulitis of left toe: Secondary | ICD-10-CM

## 2023-02-07 MED ORDER — HYDROCODONE-ACETAMINOPHEN 10-325 MG PO TABS: 1.0000 | ORAL_TABLET | Freq: Three times a day (TID) | ORAL | 0 refills | Status: AC | PRN

## 2023-02-07 MED ORDER — MUPIROCIN 2 % EX OINT: 1.0000 | TOPICAL_OINTMENT | Freq: Every day | CUTANEOUS | 0 refills | Status: AC

## 2023-02-07 MED ORDER — SULFAMETHOXAZOLE-TRIMETHOPRIM 800-160 MG PO TABS: 1.0000 | ORAL_TABLET | Freq: Two times a day (BID) | ORAL | 1 refills | Status: AC

## 2023-02-07 NOTE — Telephone Encounter (Signed)
Pt called and left message this morning 11/7 at 713am stating she called Tuesday and left message on nurse line and no one called her back. She said she had surgery on toe and is having a lot of pain and it had her up all night. She wants to be seen today and coming in this am.   Upon checking chart pt was already put on the schedule to see Dr Charlsie Merles this morning so I did not call pt back.

## 2023-02-07 NOTE — Progress Notes (Signed)
Subjective:   Patient ID: Marcia Walker, female   DOB: 67 y.o.   MRN: [id number]   HPI Patient presents with inflammation after having nail procedure right big toe stating it has been very tender and she has had to be on it a decent amount.  Wearing surgical shoe   ROS      Objective:  Physical Exam  Neurovascular status intact with patient found to have inflammation and redness around the medial right hallux bed and extending slightly proximal nothing past the inner phalangeal joint.  It is tender when pressed and localized     Assessment:  Difficult to say between paronychia or possible reaction to chemical application     Plan:  H&P reviewed she can start taking Aleve for inflammation and I did write her for hydrocodone if she has pain along with sulfa and Bactroban and patient will be seen back to recheck again as symptoms indicate given strict instructions if redness should increase or any signs of systemic infection to go to the emergency room and call us right away.  Hopefully this should heal uneventfully

## 2023-02-11 ENCOUNTER — Other Ambulatory Visit: Payer: Self-pay | Admitting: Internal Medicine

## 2023-02-11 DIAGNOSIS — Z0001 Encounter for general adult medical examination with abnormal findings: Secondary | ICD-10-CM | POA: Diagnosis not present

## 2023-02-11 DIAGNOSIS — R531 Weakness: Secondary | ICD-10-CM | POA: Diagnosis not present

## 2023-02-11 DIAGNOSIS — R262 Difficulty in walking, not elsewhere classified: Secondary | ICD-10-CM | POA: Diagnosis not present

## 2023-02-11 DIAGNOSIS — Z13820 Encounter for screening for osteoporosis: Secondary | ICD-10-CM

## 2023-02-11 DIAGNOSIS — M542 Cervicalgia: Secondary | ICD-10-CM | POA: Diagnosis not present

## 2023-02-11 DIAGNOSIS — Z1211 Encounter for screening for malignant neoplasm of colon: Secondary | ICD-10-CM | POA: Diagnosis not present

## 2023-02-11 DIAGNOSIS — M546 Pain in thoracic spine: Secondary | ICD-10-CM | POA: Diagnosis not present

## 2023-02-11 DIAGNOSIS — E1165 Type 2 diabetes mellitus with hyperglycemia: Secondary | ICD-10-CM | POA: Diagnosis not present

## 2023-02-11 DIAGNOSIS — M79675 Pain in left toe(s): Secondary | ICD-10-CM | POA: Diagnosis not present

## 2023-02-11 DIAGNOSIS — Z23 Encounter for immunization: Secondary | ICD-10-CM | POA: Diagnosis not present

## 2023-02-11 DIAGNOSIS — D72819 Decreased white blood cell count, unspecified: Secondary | ICD-10-CM | POA: Diagnosis not present

## 2023-02-11 DIAGNOSIS — Z136 Encounter for screening for cardiovascular disorders: Secondary | ICD-10-CM | POA: Diagnosis not present

## 2023-02-11 DIAGNOSIS — E785 Hyperlipidemia, unspecified: Secondary | ICD-10-CM | POA: Diagnosis not present

## 2023-02-15 ENCOUNTER — Telehealth: Payer: Self-pay | Admitting: Podiatry

## 2023-02-15 DIAGNOSIS — M25512 Pain in left shoulder: Secondary | ICD-10-CM | POA: Diagnosis not present

## 2023-02-15 DIAGNOSIS — R262 Difficulty in walking, not elsewhere classified: Secondary | ICD-10-CM | POA: Diagnosis not present

## 2023-02-15 DIAGNOSIS — M542 Cervicalgia: Secondary | ICD-10-CM | POA: Diagnosis not present

## 2023-02-15 DIAGNOSIS — R531 Weakness: Secondary | ICD-10-CM | POA: Diagnosis not present

## 2023-02-15 NOTE — Telephone Encounter (Signed)
Patient called stating that she has been taking  the prescription you gave her and it has not helped with the swelling of her feet. She states that she's been having pain and a hard time putting on her shoes, she thought maybe after taking the medicine a few days it would be better but she stated there are no signs of improvement. Please call and advise her on what she is to do   Thanks !

## 2023-02-15 NOTE — Telephone Encounter (Signed)
No idea and may take longer. She can make appointment innext few weeks if not better

## 2023-02-18 ENCOUNTER — Inpatient Hospital Stay: Admission: RE | Admit: 2023-02-18 | Payer: Medicare Other | Source: Ambulatory Visit

## 2023-02-19 DIAGNOSIS — M25512 Pain in left shoulder: Secondary | ICD-10-CM | POA: Diagnosis not present

## 2023-02-19 DIAGNOSIS — R262 Difficulty in walking, not elsewhere classified: Secondary | ICD-10-CM | POA: Diagnosis not present

## 2023-02-19 DIAGNOSIS — R531 Weakness: Secondary | ICD-10-CM | POA: Diagnosis not present

## 2023-02-19 DIAGNOSIS — H25012 Cortical age-related cataract, left eye: Secondary | ICD-10-CM | POA: Diagnosis not present

## 2023-02-19 DIAGNOSIS — M542 Cervicalgia: Secondary | ICD-10-CM | POA: Diagnosis not present

## 2023-02-22 DIAGNOSIS — M545 Low back pain, unspecified: Secondary | ICD-10-CM | POA: Diagnosis not present

## 2023-02-22 DIAGNOSIS — M542 Cervicalgia: Secondary | ICD-10-CM | POA: Diagnosis not present

## 2023-02-22 DIAGNOSIS — R262 Difficulty in walking, not elsewhere classified: Secondary | ICD-10-CM | POA: Diagnosis not present

## 2023-02-22 DIAGNOSIS — R531 Weakness: Secondary | ICD-10-CM | POA: Diagnosis not present

## 2023-02-26 DIAGNOSIS — R262 Difficulty in walking, not elsewhere classified: Secondary | ICD-10-CM | POA: Diagnosis not present

## 2023-02-26 DIAGNOSIS — R531 Weakness: Secondary | ICD-10-CM | POA: Diagnosis not present

## 2023-02-26 DIAGNOSIS — M546 Pain in thoracic spine: Secondary | ICD-10-CM | POA: Diagnosis not present

## 2023-02-26 DIAGNOSIS — M542 Cervicalgia: Secondary | ICD-10-CM | POA: Diagnosis not present

## 2023-03-04 DIAGNOSIS — E1165 Type 2 diabetes mellitus with hyperglycemia: Secondary | ICD-10-CM | POA: Diagnosis not present

## 2023-03-05 DIAGNOSIS — Z23 Encounter for immunization: Secondary | ICD-10-CM | POA: Diagnosis not present

## 2023-03-05 DIAGNOSIS — H2511 Age-related nuclear cataract, right eye: Secondary | ICD-10-CM | POA: Diagnosis not present

## 2023-03-05 DIAGNOSIS — H2512 Age-related nuclear cataract, left eye: Secondary | ICD-10-CM | POA: Diagnosis not present

## 2023-03-11 ENCOUNTER — Telehealth: Payer: Self-pay | Admitting: Podiatry

## 2023-03-11 DIAGNOSIS — R262 Difficulty in walking, not elsewhere classified: Secondary | ICD-10-CM | POA: Diagnosis not present

## 2023-03-11 DIAGNOSIS — R531 Weakness: Secondary | ICD-10-CM | POA: Diagnosis not present

## 2023-03-11 DIAGNOSIS — M25512 Pain in left shoulder: Secondary | ICD-10-CM | POA: Diagnosis not present

## 2023-03-11 DIAGNOSIS — M542 Cervicalgia: Secondary | ICD-10-CM | POA: Diagnosis not present

## 2023-03-11 NOTE — Telephone Encounter (Signed)
She can come in Wednesday 11:30

## 2023-03-11 NOTE — Telephone Encounter (Signed)
Patient called stating that she was having some toe pain from a nail removal you did back in October. She stated she went to her primary care for some pain she was having and they had told her she needed to come back and be seen at our office because the nail does not look right. The patient also stated that her toe is still red and irritating as well as hard to wear shoes. I have made an appointment for her to come and be seen is there anyway you can see her sooner? if so please let me know a day and time.Can you please call her and help address some concerns she may have.  Thank you

## 2023-03-15 ENCOUNTER — Ambulatory Visit: Payer: Medicare Other | Admitting: Podiatry

## 2023-03-19 DIAGNOSIS — R262 Difficulty in walking, not elsewhere classified: Secondary | ICD-10-CM | POA: Diagnosis not present

## 2023-03-19 DIAGNOSIS — R531 Weakness: Secondary | ICD-10-CM | POA: Diagnosis not present

## 2023-03-19 DIAGNOSIS — M25512 Pain in left shoulder: Secondary | ICD-10-CM | POA: Diagnosis not present

## 2023-03-19 DIAGNOSIS — M542 Cervicalgia: Secondary | ICD-10-CM | POA: Diagnosis not present

## 2023-03-20 DIAGNOSIS — R262 Difficulty in walking, not elsewhere classified: Secondary | ICD-10-CM | POA: Diagnosis not present

## 2023-03-20 DIAGNOSIS — M546 Pain in thoracic spine: Secondary | ICD-10-CM | POA: Diagnosis not present

## 2023-03-20 DIAGNOSIS — R531 Weakness: Secondary | ICD-10-CM | POA: Diagnosis not present

## 2023-03-20 DIAGNOSIS — M542 Cervicalgia: Secondary | ICD-10-CM | POA: Diagnosis not present

## 2023-04-02 DIAGNOSIS — A6009 Herpesviral infection of other urogenital tract: Secondary | ICD-10-CM | POA: Diagnosis not present

## 2023-04-02 DIAGNOSIS — Z7185 Encounter for immunization safety counseling: Secondary | ICD-10-CM | POA: Diagnosis not present

## 2023-04-02 DIAGNOSIS — R030 Elevated blood-pressure reading, without diagnosis of hypertension: Secondary | ICD-10-CM | POA: Diagnosis not present

## 2023-04-02 DIAGNOSIS — L282 Other prurigo: Secondary | ICD-10-CM | POA: Diagnosis not present

## 2023-04-04 ENCOUNTER — Ambulatory Visit: Payer: Medicare Other | Admitting: Podiatry

## 2023-04-05 DIAGNOSIS — M546 Pain in thoracic spine: Secondary | ICD-10-CM | POA: Diagnosis not present

## 2023-04-05 DIAGNOSIS — M542 Cervicalgia: Secondary | ICD-10-CM | POA: Diagnosis not present

## 2023-04-05 DIAGNOSIS — R531 Weakness: Secondary | ICD-10-CM | POA: Diagnosis not present

## 2023-04-05 DIAGNOSIS — R262 Difficulty in walking, not elsewhere classified: Secondary | ICD-10-CM | POA: Diagnosis not present

## 2023-04-10 ENCOUNTER — Ambulatory Visit: Payer: Medicare Other | Admitting: Podiatry

## 2023-04-12 DIAGNOSIS — R531 Weakness: Secondary | ICD-10-CM | POA: Diagnosis not present

## 2023-04-12 DIAGNOSIS — R262 Difficulty in walking, not elsewhere classified: Secondary | ICD-10-CM | POA: Diagnosis not present

## 2023-04-12 DIAGNOSIS — M542 Cervicalgia: Secondary | ICD-10-CM | POA: Diagnosis not present

## 2023-04-12 DIAGNOSIS — M25512 Pain in left shoulder: Secondary | ICD-10-CM | POA: Diagnosis not present

## 2023-04-19 DIAGNOSIS — M542 Cervicalgia: Secondary | ICD-10-CM | POA: Diagnosis not present

## 2023-04-19 DIAGNOSIS — R531 Weakness: Secondary | ICD-10-CM | POA: Diagnosis not present

## 2023-04-19 DIAGNOSIS — M25512 Pain in left shoulder: Secondary | ICD-10-CM | POA: Diagnosis not present

## 2023-04-19 DIAGNOSIS — R262 Difficulty in walking, not elsewhere classified: Secondary | ICD-10-CM | POA: Diagnosis not present

## 2023-04-22 DIAGNOSIS — M25512 Pain in left shoulder: Secondary | ICD-10-CM | POA: Diagnosis not present

## 2023-04-22 DIAGNOSIS — R531 Weakness: Secondary | ICD-10-CM | POA: Diagnosis not present

## 2023-04-22 DIAGNOSIS — M542 Cervicalgia: Secondary | ICD-10-CM | POA: Diagnosis not present

## 2023-04-22 DIAGNOSIS — R262 Difficulty in walking, not elsewhere classified: Secondary | ICD-10-CM | POA: Diagnosis not present

## 2023-04-25 DIAGNOSIS — H2512 Age-related nuclear cataract, left eye: Secondary | ICD-10-CM | POA: Diagnosis not present

## 2023-04-26 ENCOUNTER — Ambulatory Visit: Payer: Medicare Other | Admitting: Podiatry

## 2023-05-01 ENCOUNTER — Ambulatory Visit: Payer: Medicare Other | Admitting: Podiatry

## 2023-05-01 ENCOUNTER — Encounter: Payer: Self-pay | Admitting: Podiatry

## 2023-05-01 DIAGNOSIS — L600 Ingrowing nail: Secondary | ICD-10-CM | POA: Diagnosis not present

## 2023-05-01 NOTE — Progress Notes (Signed)
Subjective:   Patient ID: Marcia Walker, female   DOB: 68 y.o.   MRN: [id number]   HPI Patient presents concerned about some scabbing on the ingrown toenail that was removed several months ago.  States she is wearing regular shoes now but cannot wear all her shoes   ROS      Objective:  Physical Exam  Neurovascular status intact with patient found to have a crusted right hallux medial border localized to the area with no drainage noted or other pathology     Assessment:  Ingrown toenail deformity that is healing well right with crusted scab tissue     Plan:  Reviewed this condition and applied cushioning to the area to take pressure off but advised on wider shoes reappoint for [place] to recheck as needed

## 2023-05-06 DIAGNOSIS — R531 Weakness: Secondary | ICD-10-CM | POA: Diagnosis not present

## 2023-05-06 DIAGNOSIS — R262 Difficulty in walking, not elsewhere classified: Secondary | ICD-10-CM | POA: Diagnosis not present

## 2023-05-06 DIAGNOSIS — M546 Pain in thoracic spine: Secondary | ICD-10-CM | POA: Diagnosis not present

## 2023-05-06 DIAGNOSIS — M542 Cervicalgia: Secondary | ICD-10-CM | POA: Diagnosis not present

## 2023-05-09 DIAGNOSIS — E119 Type 2 diabetes mellitus without complications: Secondary | ICD-10-CM | POA: Diagnosis not present

## 2023-05-17 DIAGNOSIS — R531 Weakness: Secondary | ICD-10-CM | POA: Diagnosis not present

## 2023-05-17 DIAGNOSIS — M542 Cervicalgia: Secondary | ICD-10-CM | POA: Diagnosis not present

## 2023-05-17 DIAGNOSIS — R262 Difficulty in walking, not elsewhere classified: Secondary | ICD-10-CM | POA: Diagnosis not present

## 2023-05-17 DIAGNOSIS — M546 Pain in thoracic spine: Secondary | ICD-10-CM | POA: Diagnosis not present

## 2023-05-21 DIAGNOSIS — R262 Difficulty in walking, not elsewhere classified: Secondary | ICD-10-CM | POA: Diagnosis not present

## 2023-05-21 DIAGNOSIS — M546 Pain in thoracic spine: Secondary | ICD-10-CM | POA: Diagnosis not present

## 2023-05-21 DIAGNOSIS — M542 Cervicalgia: Secondary | ICD-10-CM | POA: Diagnosis not present

## 2023-05-21 DIAGNOSIS — R531 Weakness: Secondary | ICD-10-CM | POA: Diagnosis not present

## 2023-05-22 DIAGNOSIS — E785 Hyperlipidemia, unspecified: Secondary | ICD-10-CM | POA: Diagnosis not present

## 2023-05-22 DIAGNOSIS — L600 Ingrowing nail: Secondary | ICD-10-CM | POA: Diagnosis not present

## 2023-05-22 DIAGNOSIS — E1165 Type 2 diabetes mellitus with hyperglycemia: Secondary | ICD-10-CM | POA: Diagnosis not present

## 2023-05-22 DIAGNOSIS — M6530 Trigger finger, unspecified finger: Secondary | ICD-10-CM | POA: Diagnosis not present

## 2023-05-28 DIAGNOSIS — M546 Pain in thoracic spine: Secondary | ICD-10-CM | POA: Diagnosis not present

## 2023-05-28 DIAGNOSIS — R262 Difficulty in walking, not elsewhere classified: Secondary | ICD-10-CM | POA: Diagnosis not present

## 2023-05-28 DIAGNOSIS — M542 Cervicalgia: Secondary | ICD-10-CM | POA: Diagnosis not present

## 2023-05-28 DIAGNOSIS — R531 Weakness: Secondary | ICD-10-CM | POA: Diagnosis not present

## 2023-06-04 DIAGNOSIS — M546 Pain in thoracic spine: Secondary | ICD-10-CM | POA: Diagnosis not present

## 2023-06-04 DIAGNOSIS — M542 Cervicalgia: Secondary | ICD-10-CM | POA: Diagnosis not present

## 2023-06-04 DIAGNOSIS — R262 Difficulty in walking, not elsewhere classified: Secondary | ICD-10-CM | POA: Diagnosis not present

## 2023-06-04 DIAGNOSIS — R531 Weakness: Secondary | ICD-10-CM | POA: Diagnosis not present

## 2023-06-25 DIAGNOSIS — M65331 Trigger finger, right middle finger: Secondary | ICD-10-CM | POA: Diagnosis not present

## 2023-07-30 DIAGNOSIS — H43813 Vitreous degeneration, bilateral: Secondary | ICD-10-CM | POA: Diagnosis not present

## 2023-07-30 DIAGNOSIS — Z961 Presence of intraocular lens: Secondary | ICD-10-CM | POA: Diagnosis not present

## 2023-08-28 ENCOUNTER — Ambulatory Visit: Admitting: Podiatry

## 2023-08-28 ENCOUNTER — Encounter: Payer: Self-pay | Admitting: Podiatry

## 2023-08-28 DIAGNOSIS — L600 Ingrowing nail: Secondary | ICD-10-CM | POA: Diagnosis not present

## 2023-08-28 DIAGNOSIS — M779 Enthesopathy, unspecified: Secondary | ICD-10-CM

## 2023-08-28 NOTE — Patient Instructions (Signed)

## 2023-08-28 NOTE — Progress Notes (Signed)
 Subjective:   Patient ID: Marcia Walker, female   DOB: 68 y.o.   MRN: [id number]   HPI Patient presents stating that his toenail is hard in that spot and it can bother me at times.  Have not noted any drainage or redness   ROS      Objective:  Physical Exam  Neurovascular status intact digital perfusion found to be good with the patient having an incurvated medial border of the right hallux in the proximal portion localized no redness or other pathology associated with it with mild discomfort     Assessment:  Possibility for ingrown toenail or spicule formation right hallux also has moderate tenderness symptoms and wants new orthotics     Plan:  H&P reviewed I did anesthetize the right big toe after allowing her to read consent form for removal of spicule 60 mg like Marcaine  mixture.  Sterile prep done using sterile instrumentation I removed the medial border and cleaned it out apply chemical phenol 3 applications 30 seconds followed by alcohol lavage sterile dressing gave instructions on soaks and reappoint to recheck as needed and leave dressing on 24 hours take it off earlier if throbbing or to occur.  She wants orthotics for dress shoes an appointment is made with pedorthist to have this made

## 2023-10-08 ENCOUNTER — Other Ambulatory Visit: Payer: Medicare Other

## 2023-10-10 ENCOUNTER — Other Ambulatory Visit

## 2023-10-14 DIAGNOSIS — M65341 Trigger finger, right ring finger: Secondary | ICD-10-CM | POA: Diagnosis not present

## 2023-10-24 ENCOUNTER — Other Ambulatory Visit

## 2023-10-31 ENCOUNTER — Other Ambulatory Visit (HOSPITAL_BASED_OUTPATIENT_CLINIC_OR_DEPARTMENT_OTHER)

## 2023-11-07 DIAGNOSIS — K080 Exfoliation of teeth due to systemic causes: Secondary | ICD-10-CM | POA: Diagnosis not present

## 2023-11-30 DIAGNOSIS — H43393 Other vitreous opacities, bilateral: Secondary | ICD-10-CM | POA: Diagnosis not present

## 2023-11-30 DIAGNOSIS — H25012 Cortical age-related cataract, left eye: Secondary | ICD-10-CM | POA: Diagnosis not present

## 2023-12-24 DIAGNOSIS — R4189 Other symptoms and signs involving cognitive functions and awareness: Secondary | ICD-10-CM | POA: Diagnosis not present

## 2023-12-24 DIAGNOSIS — M79641 Pain in right hand: Secondary | ICD-10-CM | POA: Diagnosis not present

## 2023-12-24 DIAGNOSIS — J302 Other seasonal allergic rhinitis: Secondary | ICD-10-CM | POA: Diagnosis not present

## 2024-02-11 DIAGNOSIS — M65341 Trigger finger, right ring finger: Secondary | ICD-10-CM | POA: Diagnosis not present

## 2024-02-11 DIAGNOSIS — M65331 Trigger finger, right middle finger: Secondary | ICD-10-CM | POA: Diagnosis not present

## 2024-02-13 DIAGNOSIS — E785 Hyperlipidemia, unspecified: Secondary | ICD-10-CM | POA: Diagnosis not present

## 2024-02-13 DIAGNOSIS — E538 Deficiency of other specified B group vitamins: Secondary | ICD-10-CM | POA: Diagnosis not present

## 2024-02-13 DIAGNOSIS — E559 Vitamin D deficiency, unspecified: Secondary | ICD-10-CM | POA: Diagnosis not present

## 2024-02-13 DIAGNOSIS — E1165 Type 2 diabetes mellitus with hyperglycemia: Secondary | ICD-10-CM | POA: Diagnosis not present

## 2024-02-19 DIAGNOSIS — M65341 Trigger finger, right ring finger: Secondary | ICD-10-CM | POA: Diagnosis not present

## 2024-03-04 DIAGNOSIS — J329 Chronic sinusitis, unspecified: Secondary | ICD-10-CM | POA: Diagnosis not present

## 2024-03-04 DIAGNOSIS — J309 Allergic rhinitis, unspecified: Secondary | ICD-10-CM | POA: Diagnosis not present

## 2024-03-13 DIAGNOSIS — M62838 Other muscle spasm: Secondary | ICD-10-CM | POA: Diagnosis not present

## 2024-03-13 DIAGNOSIS — S161XXA Strain of muscle, fascia and tendon at neck level, initial encounter: Secondary | ICD-10-CM | POA: Diagnosis not present

## 2024-03-13 DIAGNOSIS — M25512 Pain in left shoulder: Secondary | ICD-10-CM | POA: Diagnosis not present

## 2024-03-16 NOTE — Progress Notes (Addendum)
 Marcia Walker                                          MRN: [id number]   03/16/2024   The [facility] Quality Team Specialist reviewed this patient medical record for the purposes of chart review for care gap closure. The following were reviewed: chart review for care gap closure-kidney health evaluation for diabetes:eGFR  and uACR.  04/16/2024- cannot close KED 2025    [facility] Quality Team

## 2024-05-05 ENCOUNTER — Other Ambulatory Visit (HOSPITAL_BASED_OUTPATIENT_CLINIC_OR_DEPARTMENT_OTHER)

## 2024-05-09 ENCOUNTER — Other Ambulatory Visit (HOSPITAL_BASED_OUTPATIENT_CLINIC_OR_DEPARTMENT_OTHER)

## 2024-05-12 ENCOUNTER — Other Ambulatory Visit (HOSPITAL_BASED_OUTPATIENT_CLINIC_OR_DEPARTMENT_OTHER)
